# Patient Record
Sex: Female | Born: 1980 | Race: Black or African American | Hispanic: No | Marital: Single | Smoking: Never smoker
Health system: Southern US, Community
[De-identification: ages and names within clinical notes are randomized; demographics above are authoritative.]

## PROBLEM LIST (undated history)

## (undated) DIAGNOSIS — E119 Type 2 diabetes mellitus without complications: Secondary | ICD-10-CM

## (undated) DIAGNOSIS — B9689 Other specified bacterial agents as the cause of diseases classified elsewhere: Secondary | ICD-10-CM

## (undated) DIAGNOSIS — N760 Acute vaginitis: Secondary | ICD-10-CM

---

## 2015-04-09 ENCOUNTER — Encounter (HOSPITAL_COMMUNITY): Payer: Self-pay

## 2015-04-09 ENCOUNTER — Emergency Department (HOSPITAL_COMMUNITY)
Admission: EM | Admit: 2015-04-09 | Discharge: 2015-04-09 | Discharge status: Against Medical Advice | Payer: Medicaid Other | Attending: Emergency Medicine | Admitting: Emergency Medicine

## 2015-04-09 DIAGNOSIS — R1030 Lower abdominal pain, unspecified: Secondary | ICD-10-CM | POA: Diagnosis not present

## 2015-04-09 DIAGNOSIS — Z880 Allergy status to penicillin: Secondary | ICD-10-CM | POA: Diagnosis not present

## 2015-04-09 DIAGNOSIS — R42 Dizziness and giddiness: Secondary | ICD-10-CM | POA: Diagnosis not present

## 2015-04-09 DIAGNOSIS — N898 Other specified noninflammatory disorders of vagina: Secondary | ICD-10-CM | POA: Insufficient documentation

## 2015-04-09 DIAGNOSIS — R112 Nausea with vomiting, unspecified: Secondary | ICD-10-CM | POA: Diagnosis present

## 2015-04-09 DIAGNOSIS — Z5321 Procedure and treatment not carried out due to patient leaving prior to being seen by health care provider: Secondary | ICD-10-CM | POA: Diagnosis not present

## 2015-04-09 DIAGNOSIS — R5383 Other fatigue: Secondary | ICD-10-CM | POA: Diagnosis not present

## 2015-04-09 DIAGNOSIS — Z9119 Patient's noncompliance with other medical treatment and regimen: Secondary | ICD-10-CM

## 2015-04-09 DIAGNOSIS — Z5320 Procedure and treatment not carried out because of patient's decision for unspecified reasons: Secondary | ICD-10-CM

## 2015-04-09 LAB — COMPREHENSIVE METABOLIC PANEL
ALK PHOS: 97 U/L (ref 38–126)
ALT: 21 U/L (ref 14–54)
AST: 25 U/L (ref 15–41)
Albumin: 3.9 g/dL (ref 3.5–5.0)
Anion gap: 13 (ref 5–15)
BILIRUBIN TOTAL: 0.5 mg/dL (ref 0.3–1.2)
BUN: 9 mg/dL (ref 6–20)
CALCIUM: 9.1 mg/dL (ref 8.9–10.3)
CHLORIDE: 98 mmol/L — AB (ref 101–111)
CO2: 23 mmol/L (ref 22–32)
CREATININE: 0.95 mg/dL (ref 0.44–1.00)
GFR calc Af Amer: >60 mL/min (ref >60)
GLUCOSE: 360 mg/dL — AB (ref 65–99)
Potassium: 4.4 mmol/L (ref 3.5–5.1)
SODIUM: 134 mmol/L — AB (ref 135–145)
TOTAL PROTEIN: 7.6 g/dL (ref 6.5–8.1)

## 2015-04-09 LAB — URINALYSIS, ROUTINE W REFLEX MICROSCOPIC
BILIRUBIN URINE: NEGATIVE
Ketones, ur: 15 mg/dL — AB
NITRITE: NEGATIVE
PROTEIN: NEGATIVE mg/dL
Specific Gravity, Urine: 1.039 — ABNORMAL HIGH (ref 1.005–1.030)
Urobilinogen, UA: 0.2 mg/dL (ref 0.0–1.0)
pH: 6 (ref 5.0–8.0)

## 2015-04-09 LAB — URINE MICROSCOPIC-ADD ON

## 2015-04-09 LAB — CBC WITH DIFFERENTIAL/PLATELET
Basophils Absolute: 0 10*3/uL (ref 0.0–0.1)
Basophils Relative: 0 % (ref 0–1)
EOS ABS: 0.1 10*3/uL (ref 0.0–0.7)
EOS PCT: 2 % (ref 0–5)
HEMATOCRIT: 43.7 % (ref 36.0–46.0)
HEMOGLOBIN: 14.8 g/dL (ref 12.0–15.0)
Lymphocytes Relative: 41 % (ref 12–46)
Lymphs Abs: 2.5 10*3/uL (ref 0.7–4.0)
MCH: 29.1 pg (ref 26.0–34.0)
MCHC: 33.9 g/dL (ref 30.0–36.0)
MCV: 85.9 fL (ref 78.0–100.0)
MONO ABS: 0.5 10*3/uL (ref 0.1–1.0)
MONOS PCT: 7 % (ref 3–12)
NEUTROS ABS: 3.1 10*3/uL (ref 1.7–7.7)
NEUTROS PCT: 50 % (ref 43–77)
Platelets: 236 10*3/uL (ref 150–400)
RBC: 5.09 MIL/uL (ref 3.87–5.11)
RDW: 14 % (ref 11.5–15.5)
WBC: 6.2 10*3/uL (ref 4.0–10.5)

## 2015-04-09 LAB — I-STAT BETA HCG BLOOD, ED (MC, WL, AP ONLY)

## 2015-04-09 LAB — LIPASE, BLOOD: LIPASE: 15 U/L — AB (ref 22–51)

## 2015-04-09 MED ORDER — SODIUM CHLORIDE 0.9 % IV BOLUS (SEPSIS): 1000.0000 mL | Freq: Once | INTRAVENOUS | Status: DC

## 2015-04-09 NOTE — ED Notes (Addendum)
Pt c/o feeling "sick, feeling dizzy" x2 weeks and is concerned b/c she has "milky" vaginal discharge w/ no period x 2 months. Pt c/o feeling sick in morning. Pt hx diabetes and has not taken insulin in 6 months.

## 2015-04-09 NOTE — ED Notes (Signed)
Pt refusing IV at this time - states this is not necessary after explaining risks/benefits of not having IV

## 2015-04-09 NOTE — ED Provider Notes (Addendum)
CSN: [id number]     Arrival date & time 04/09/15  4098 History   First MD Initiated Contact with Patient 04/09/15 [phone]     Chief Complaint  Patient presents with  . Abdominal Pain  . Dizziness  . Vaginal Discharge     (Consider location/radiation/quality/duration/timing/severity/associated sxs/prior Treatment) HPI Patient moved to the area 6 months ago. Reports that she has been unable to continue her normal medical care for her diabetes. She is supposed to be on insulin and has not taken it for 6 months duration. For several weeks she reports having nausea and waking in the morning with vomiting. She states she has missed her menstrual cycle for at least 2 months. She also describes some lower abdominal discomfort and vaginal discharge. She denies blurred vision, fever, cough or shortness of breath. She states she is mostly just felt very fatigued and sick and lightheaded for about 2 months. The symptoms have gotten worse over about the past 2 weeks. No past medical history on file. No past surgical history on file. No family history on file. Social History  Substance Use Topics  . Smoking status: Never Smoker   . Smokeless tobacco: Never Used  . Alcohol Use: Yes     Comment: socially   OB History    No data available     Review of Systems 10 Systems reviewed and are negative for acute change except as noted in the HPI.    Allergies  Amoxicillin; Penicillins; and Reglan  Home Medications   Prior to Admission medications   Not on File   BP 129/88 mmHg  Pulse 96  Temp(Src) 98 F (36.7 C) (Oral)  Resp 16  Ht 5\' 6"  (1.676 m)  Wt 250 lb (113.399 kg)  BMI 40.37 kg/m2  SpO2 99%  LMP 02/07/2015 Physical Exam  Constitutional: She is oriented to person, place, and time. She appears well-developed and well-nourished.  The patient is ambulatory about the emergency department with well appearance.  HENT:  Head: Normocephalic and atraumatic.  Eyes: EOM are normal. Pupils  are equal, round, and reactive to light.  Neck: Neck supple.  Cardiovascular: Normal rate, regular rhythm, normal heart sounds and intact distal pulses.   Pulmonary/Chest: Effort normal and breath sounds normal.  Abdominal: Soft. Bowel sounds are normal. She exhibits no distension. There is no tenderness.  Genitourinary:  Patient left the emergency department prior to being able to perform her pelvic examination.  Musculoskeletal: Normal range of motion. She exhibits no edema.  Neurological: She is alert and oriented to person, place, and time. She has normal strength. Coordination normal. GCS eye subscore is 4. GCS verbal subscore is 5. GCS motor subscore is 6.  Skin: Skin is warm, dry and intact.  Psychiatric: She has a normal mood and affect.    ED Course  Procedures (including critical care time) Labs Review Labs Reviewed  URINALYSIS, ROUTINE W REFLEX MICROSCOPIC (NOT AT [facility]) - Abnormal; Notable for the following:    APPearance CLOUDY (*)    Specific Gravity, Urine 1.039 (*)    Glucose, UA >1000 (*)    Hgb urine dipstick TRACE (*)    Ketones, ur 15 (*)    Leukocytes, UA MODERATE (*)    All other components within normal limits  URINE MICROSCOPIC-ADD ON - Abnormal; Notable for the following:    Squamous Epithelial / LPF MANY (*)    Bacteria, UA FEW (*)    All other components within normal limits  WET PREP, GENITAL  CBC WITH DIFFERENTIAL/PLATELET  COMPREHENSIVE METABOLIC PANEL  LIPASE, BLOOD  HIV ANTIBODY (ROUTINE TESTING)  I-STAT BETA HCG BLOOD, ED ([facility], [facility], [facility] ONLY)  GC/CHLAMYDIA PROBE AMP ([place]) NOT AT [facility]    Imaging Review No results found. I, Arby Barrette, personally reviewed and evaluated these images and lab results as part of my medical decision-making.   EKG Interpretation None      MDM   Final diagnoses:  Left before treatment completed   Patient presented with above complaints. Shortly after her evaluation, she stated to nursing staff that  she wanted to leave the emergency department to take her son out to her mother. She was advised that we could not endorse her leaving the emergency department while her treatment was underway. She stated she would have her mother come in to get her son (about 33 years old). Shortly thereafter the patient had disappeared from the room leaving her gown and having removed the monitor. She has not returned. She is considered to have left before evaluation complete. She left before any diagnostic results had returned. The patient however was well in appearance and has stable vital signs.    Arby Barrette, MD 04/09/15 1118  Arby Barrette, MD 04/09/15 1120

## 2015-04-09 NOTE — ED Notes (Signed)
This RN went to round on pt - pt and family member were not in room. Pt eloped. Pt did not make any of the staff aware that she was leaving. EDP aware.

## 2015-04-10 LAB — HIV ANTIBODY (ROUTINE TESTING W REFLEX): HIV SCREEN 4TH GENERATION: NONREACTIVE

## 2015-05-06 ENCOUNTER — Encounter (HOSPITAL_COMMUNITY): Payer: Self-pay

## 2015-05-06 ENCOUNTER — Emergency Department (HOSPITAL_COMMUNITY)
Admission: EM | Admit: 2015-05-06 | Discharge: 2015-05-06 | Disposition: A | Discharge status: Home / Self Care | Payer: Medicaid Other | Attending: Emergency Medicine | Admitting: Emergency Medicine

## 2015-05-06 DIAGNOSIS — N898 Other specified noninflammatory disorders of vagina: Secondary | ICD-10-CM | POA: Diagnosis present

## 2015-05-06 DIAGNOSIS — E1165 Type 2 diabetes mellitus with hyperglycemia: Secondary | ICD-10-CM | POA: Diagnosis not present

## 2015-05-06 DIAGNOSIS — N760 Acute vaginitis: Secondary | ICD-10-CM | POA: Insufficient documentation

## 2015-05-06 DIAGNOSIS — J069 Acute upper respiratory infection, unspecified: Secondary | ICD-10-CM

## 2015-05-06 DIAGNOSIS — R739 Hyperglycemia, unspecified: Secondary | ICD-10-CM

## 2015-05-06 DIAGNOSIS — R1084 Generalized abdominal pain: Secondary | ICD-10-CM

## 2015-05-06 DIAGNOSIS — Z880 Allergy status to penicillin: Secondary | ICD-10-CM | POA: Insufficient documentation

## 2015-05-06 DIAGNOSIS — B9689 Other specified bacterial agents as the cause of diseases classified elsewhere: Secondary | ICD-10-CM

## 2015-05-06 HISTORY — DX: Type 2 diabetes mellitus without complications: E11.9

## 2015-05-06 HISTORY — DX: Other specified bacterial agents as the cause of diseases classified elsewhere: B96.89

## 2015-05-06 HISTORY — DX: Acute vaginitis: N76.0

## 2015-05-06 LAB — URINALYSIS, ROUTINE W REFLEX MICROSCOPIC
Bilirubin Urine: NEGATIVE
Hgb urine dipstick: NEGATIVE
Ketones, ur: NEGATIVE mg/dL
NITRITE: NEGATIVE
PROTEIN: NEGATIVE mg/dL
Specific Gravity, Urine: 1.044 — ABNORMAL HIGH (ref 1.005–1.030)
Urobilinogen, UA: 1 mg/dL (ref 0.0–1.0)
pH: 7 (ref 5.0–8.0)

## 2015-05-06 LAB — CBC WITH DIFFERENTIAL/PLATELET
BASOS ABS: 0 10*3/uL (ref 0.0–0.1)
Basophils Relative: 1 % (ref 0–1)
Eosinophils Absolute: 0.1 10*3/uL (ref 0.0–0.7)
Eosinophils Relative: 1 % (ref 0–5)
HEMATOCRIT: 40.6 % (ref 36.0–46.0)
HEMOGLOBIN: 13.5 g/dL (ref 12.0–15.0)
LYMPHS PCT: 49 % — AB (ref 12–46)
Lymphs Abs: 2.6 10*3/uL (ref 0.7–4.0)
MCH: 28.8 pg (ref 26.0–34.0)
MCHC: 33.3 g/dL (ref 30.0–36.0)
MCV: 86.6 fL (ref 78.0–100.0)
MONO ABS: 0.4 10*3/uL (ref 0.1–1.0)
Monocytes Relative: 7 % (ref 3–12)
NEUTROS ABS: 2.3 10*3/uL (ref 1.7–7.7)
NEUTROS PCT: 42 % — AB (ref 43–77)
Platelets: 236 10*3/uL (ref 150–400)
RBC: 4.69 MIL/uL (ref 3.87–5.11)
RDW: 13.6 % (ref 11.5–15.5)
WBC: 5.4 10*3/uL (ref 4.0–10.5)

## 2015-05-06 LAB — COMPREHENSIVE METABOLIC PANEL
ALBUMIN: 3.8 g/dL (ref 3.5–5.0)
ALT: 16 U/L (ref 14–54)
ANION GAP: 7 (ref 5–15)
AST: 24 U/L (ref 15–41)
Alkaline Phosphatase: 82 U/L (ref 38–126)
BUN: 13 mg/dL (ref 6–20)
CO2: 28 mmol/L (ref 22–32)
Calcium: 8.9 mg/dL (ref 8.9–10.3)
Chloride: 99 mmol/L — ABNORMAL LOW (ref 101–111)
Creatinine, Ser: 0.7 mg/dL (ref 0.44–1.00)
GFR calc non Af Amer: >60 mL/min (ref >60)
GLUCOSE: 317 mg/dL — AB (ref 65–99)
POTASSIUM: 4.4 mmol/L (ref 3.5–5.1)
SODIUM: 134 mmol/L — AB (ref 135–145)
Total Bilirubin: 0.8 mg/dL (ref 0.3–1.2)
Total Protein: 7.5 g/dL (ref 6.5–8.1)

## 2015-05-06 LAB — WET PREP, GENITAL
TRICH WET PREP: NONE SEEN
Yeast Wet Prep HPF POC: NONE SEEN

## 2015-05-06 LAB — CBG MONITORING, ED
GLUCOSE-CAPILLARY: 339 mg/dL — AB (ref 65–99)
Glucose-Capillary: 243 mg/dL — ABNORMAL HIGH (ref 65–99)

## 2015-05-06 LAB — URINE MICROSCOPIC-ADD ON

## 2015-05-06 LAB — I-STAT BETA HCG BLOOD, ED (MC, WL, AP ONLY): I-stat hCG, quantitative: <5 m[IU]/mL (ref <5)

## 2015-05-06 MED ORDER — ONDANSETRON HCL 4 MG PO TABS: 4.0000 mg | ORAL_TABLET | Freq: Four times a day (QID) | ORAL | Status: DC

## 2015-05-06 MED ORDER — ONDANSETRON HCL 4 MG/2ML IJ SOLN
4.0000 mg | Freq: Once | INTRAMUSCULAR | Status: AC
  Administered 2015-05-06: 4 mg via INTRAVENOUS
  Filled 2015-05-06: qty 2

## 2015-05-06 MED ORDER — METRONIDAZOLE 500 MG PO TABS: 500.0000 mg | ORAL_TABLET | Freq: Two times a day (BID) | ORAL | Status: DC

## 2015-05-06 MED ORDER — SODIUM CHLORIDE 0.9 % IV BOLUS (SEPSIS)
1000.0000 mL | Freq: Once | INTRAVENOUS | Status: AC
  Administered 2015-05-06: 1000 mL via INTRAVENOUS

## 2015-05-06 NOTE — ED Notes (Signed)
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  Has had symptoms for over 3 weeks.  White creamy discharge.  No odor.  Is painful. Pt is sexually active and uses condoms sometimes.  Pt states also wants checked for nasal congestion.  Symptoms started today.  3rd complaint is that she feels her diabetes is elevated.  Also mentioned, she has missed her period this month.

## 2015-05-06 NOTE — Discharge Instructions (Signed)
°Emergency Department Resource Guide °1) Find a Doctor and Pay Out of Pocket °Although you won't have to find out who is covered by your insurance plan, it is a good idea to ask around and get recommendations. You will then need to call the office and see if the doctor you have chosen will accept you as a new patient and what types of options they offer for patients who are self-pay. Some doctors offer discounts or will set up payment plans for their patients who do not have insurance, but you will need to ask so you aren't surprised when you get to your appointment. ° °2) Contact Your Local Health Department °Not all health departments have doctors that can see patients for sick visits, but many do, so it is worth a call to see if yours does. If you don't know where your local health department is, you can check in your phone book. The CDC also has a tool to help you locate your state's health department, and many state websites also have listings of all of their local health departments. ° °3) Find a Walk-in Clinic °If your illness is not likely to be very severe or complicated, you may want to try a walk in clinic. These are popping up all over the country in pharmacies, drugstores, and shopping centers. They're usually staffed by nurse practitioners or physician assistants that have been trained to treat common illnesses and complaints. They're usually fairly quick and inexpensive. However, if you have serious medical issues or chronic medical problems, these are probably not your best option. ° °No Primary Care Doctor: °- Call [facility] at  832-8000 - they can help you locate a primary care doctor that  accepts your insurance, provides certain services, etc. °- Physician Referral Service- 1-800-533-3463 ° °Chronic Pain Problems: °Organization         Address  Phone   Notes  °[facility]  (336) 297-2271 Patients need to be referred by their primary care doctor.  ° °Medication  Assistance: °Organization         Address  Phone   Notes  °[place] [facility] [address] °[place], [place] [address] (336) 641-8030 --Must be a resident of [place] °-- Must have NO insurance coverage whatsoever (no Medicaid/ Medicare, etc.) °-- The pt. MUST have a primary care doctor that directs their care regularly and follows them in the community °  °[facility]  (866) 331-1348   °[employer]  (888) 892-1162   ° °Agencies that provide inexpensive medical care: °Organization         Address  Phone   Notes  °[facility]  (336) 832-8035   °[facility]    (336) 832-7272   °[facility] [address] °[place], [place] [address] (336) 832-4777   °[facility] [address], °[place] (336) 271-4999   °[facility]    (336) 373-0678   °[facility]    (336) 272-1050   °[facility] ° [address], [place] Phone:  (336) 832-4444, Fax:  (336) 832-4440 Hours of Operation:  9 am - 6 pm, M-F.  Also accepts Medicaid/Medicare and self-pay.  °[facility] ° [address], [place] Phone: (336) 832-3150, Fax: (336) 832-3151. Hours of Operation:  8:30 am - 5:30 pm, M-F.  Also accepts Medicaid and self-pay.  °[facility] [place] [address], [place] Phone: (336) 878-6027   °[facility] [address], [place], [place] (336)723-1848, Ext. 123 Mondays & Thursdays: 7-9 AM.  First 15 patients are seen on a first come, first serve basis. °  ° °Medicaid-accepting [place] Providers: ° °Organization         Address  Phone   Notes  °[facility] [address], [place] (336) 641-2100 Also accepts self-pay patients.  °[facility] [address], [place] ° (336) 856-9996   °[facility] [address], [place]  (336) 288-8857   °[facility] [address] [place] [address], [place] (336) 299-7000   °Veita Bland [address], [place]  ° (336) 373-1557 Only accepts [place] Access Medicaid patients after they have their name applied to their card.  ° °Self-Pay (no insurance) in [place]: ° °Organization         Address  Phone   Notes  °Sickle Cell Patients, [facility] [address], [place] (336) 832-1970   °[facility] [address], [place] (336) 832-4400   °[facility] ° [address], [place] (336) 992-4800   °[facility]/Dr. Osei-Bonsu ° [address] [place] [address], [place] or [address], [place] (336) 841-8500 Phone number for both [place] and [place] locations is the same.  °[facility] [address], [place] (336) 299-0000   °[facility] [address] [place] [address], [place] or [address] (336) 852-7530 °(336) 878-2260   °[facility] [address], [place] (336) 350-1642, phone; (336) 294-5005, fax Sees patients 1st and 3rd Saturday of every month.  Must not qualify for public or private insurance (i.e. Medicaid, Medicare, [place] Health Choice, Veterans' Benefits) • Household income should be no more than 200% of the poverty level •The clinic cannot treat you if you are pregnant or think you are pregnant • Sexually transmitted diseases are not treated at the clinic.  ° ° °Dental Care: °Organization         Address  Phone  Notes  °[place] [facility] [address], [place] (336) 641-6152 Accepts children up to age 21 who are enrolled in Medicaid or [place] Health Choice; pregnant women with a Medicaid card; and children who have applied for Medicaid or [place] Health Choice, but were declined, whose parents can pay a reduced fee at time of service.  °[place]  [facility] [place]  [address], [place] (336) 641-7733 Accepts children up to age 21 who are enrolled in Medicaid or [place] Health Choice; pregnant women with a Medicaid card; and children who have applied for Medicaid or [place] Health Choice, but were declined, whose parents can pay a reduced fee at time of service.  °[facility] ° [address],  (336) 641-4533 Patients are seen by appointment only. Walk-ins are not accepted. [facility] will see patients 18 years of age and older. °Monday - Tuesday (8am-5pm) °Most Wednesdays (8:30-5pm) °$30 per visit, cash only  °[facility] ° [address], [place] (336) 641-4533 Patients are seen by appointment only. Walk-ins are not accepted. [facility] will see patients 18 years of age and older. °One   Wednesday Evening (Monthly: Volunteer Based).  $30 per visit, cash only  °[facility]  (919) 537-3737 for adults; Children under age 4, call Graduate Pediatric Dentistry at ([phone]) [phone]. Children aged 4-14, please call (919) [phone] to request a pediatric application. ° Dental services are provided in all areas of dental care including fillings, crowns and bridges, complete and partial dentures, implants, gum treatment, root canals, and extractions. Preventive care is also provided. Treatment is provided to both adults and children. °Patients are selected via a lottery and there is often a waiting list. °  °[facility] [address], °[place] ° (336) 763-8833 [email or web address] °  °[facility] [address], [place], [place] (336)723-1848, Ext. 123 Second and Fourth Thursday of each month, opens at 6:30 AM; Clinic ends at 9 AM.  Patients are seen on a first-come first-served basis, and a limited number are seen during each clinic.  ° °[facility] ° [address], [place], [place] (336) 723-7904    Eligibility Requirements °You must have lived in [place], [place], or [place] counties for at least the last three months. °  You cannot be eligible for state or federal sponsored healthcare insurance, including Veterans Administration, Medicaid, or Medicare. °  You generally cannot be eligible for healthcare insurance through your employer.  °  How to apply: °Eligibility screenings are held every Tuesday and Wednesday afternoon from 1:00 pm until 4:00 pm. You do not need an appointment for the interview!  °[facility] [address], [place], [place] 336-631-2330   °[facility]  336-342-8273   °[facility]  336-703-3100   °[facility]  336-570-6415   ° °Behavioral Health Resources in the Community: °Intensive Outpatient Programs °Organization         Address  Phone  Notes  °[facility] [address], [place], [place] 336-878-6098   °[facility] [address], [place], [place] 336-832-9800   °[facility] [address], [place], [place] ° 336-882-2125   °[facility] [address],  °[place], [place] 1-800-853-5163 or 336-641-4981   °Substance Abuse Resources °Organization         Address  Phone  Notes  °[facility]  336-882-2125   °[facility]  336-784-9470   °[facility]  336-285-9073   °[facility]  336-845-3988   °Residential & Outpatient Substance Abuse Program  1-800-659-3381   °Psychological Services °Organization         Address  Phone  Notes  °[facility]  336- 832-9600   °[facility]  336- 378-7881   °[facility] [address], [place] 1-800-853-5163 or 336-641-4981   ° °Mobile Crisis Teams °Organization         Address  Phone  Notes  °[facility], Mobile Crisis Care Unit  1-877-626-1772   °Assertive °[facility] ° [address]  [place], [place] 336-834-9664   °Sharon DeEsch [address] °[place] 336-554-5454   ° °Self-Help/Support Groups °Organization         Address  Phone             Notes  °[facility] [place] - variety of support groups  336- 373-1402 Call for more information  °Narcotics Anonymous (NA), [facility] [address], °[place]   2 meetings at this location  ° °  Residential Treatment Programs °Organization         Address  Phone  Notes  °[facility] [address],    °[place]  1-866-801-8205   °[facility] ° [address], [place], [place] 704-293-8524   °[facility] [address], [place] 336-845-3988 Admissions: 8am-3pm M-F  °[facility] [address]    °[place], [place] 336-841-1104   °[facility] [address], [place], [place] 336-379-7146   °[facility] [address]  °[place], [place] 336-285-9073   °[facility] Intensive Outpatient [address], [place], [place] 336-852-3033   °[facility] [address]  °[place], [place] 1-877-615-2722 or 336-784-9470   °[facility] [address] [place], [place] 336-227-7417 Accepts Medicaid  °[facility] [address]  °[place] 1-800-659-3381 Substance Abuse/Addiction Treatment  ° °[place] Behavioral Health Resources °Organization         Address  Phone  Notes  °[facility]  (888) 581-9988   °Julie Brannon, PhD [address] [place], [place]   (336) 349-5553 or (336) 951-0000   °[facility]   [address] °[place], [place] (336) 349-4454   °[facility] [address], [place], [place] (336) 342-8316 Insurance/Medicaid/sponsorship through [facility]  °[facility] [address]                                    [place], [place] (336) 342-8316 Therapy/tele-psych/case    °[facility] [address]  ° [place], [place] (336) 349-2233    °Dr. Arfeen  (336) 349-4544   °[facility] [place]  [employer] [place] [facility] 1) [address],  °2) [address], [place] °3)  [address], [place] (336) 349-3220 °(336) 342-7768 ° °(336) 342-8140   °[place] [facility] (336) 342-1394 or (336) 342-3537 (After Hours)    ° ° °

## 2015-05-06 NOTE — ED Notes (Signed)
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

## 2015-05-06 NOTE — ED Provider Notes (Signed)
CSN: [id number]     Arrival date & time 05/06/15  0844 History   First MD Initiated Contact with Patient 05/06/15 [phone]     Chief Complaint  Patient presents with  . Vaginal Discharge  . Nasal Congestion     (Consider location/radiation/quality/duration/timing/severity/associated sxs/prior Treatment) HPI Comments: Patient with a history of Insulin Dependent Type II DM presents today with multiple complaints.  She is complaining of vaginal discharge.  Discharge is white creamy and has been present intermittently for the past 2 months.  She was seen for the same in the ED on 04/09/15., but left AMA prior to completion of pelvic exam and work up. She states that she was also seen in July, 2 months ago, for the same and was treated for BV with Flagyl.   She also is complaining of diffuse abdominal pain that has been present for the past couple of months.  Pain is unchanged.  She is not taking anything for pain.  She also reports intermittent nausea and vomiting over the past month.  She denies diarrhea or constipation.  Denies urinary symptoms.    Patient is also complaining of nasal congestion, cough, and sore throat. Onset of symptoms yesterday evening.  She has taken Dayquil for the symptoms without relief.  She denies chest pain or SOB.  Denies ear pain or sinus pain.    She is also complaining of an elevated blood sugar.  She states that she has been on Insulin in the past, but has not taken the Insulin in the past 7 months.  She reports that she has checked her blood sugar intermittently and it has been running in the 300's.  She states that she does not currently have a PCP.    Patient is a 34 y.o. female presenting with vaginal discharge. The history is provided by the patient.  Vaginal Discharge   Past Medical History  Diagnosis Date  . Diabetes mellitus without complication   . Bacterial vaginosis    Past Surgical History  Procedure Laterality Date  . Cesarean section     History  reviewed. No pertinent family history. Social History  Substance Use Topics  . Smoking status: Never Smoker   . Smokeless tobacco: Never Used  . Alcohol Use: Yes     Comment: socially   OB History    No data available     Review of Systems  Genitourinary: Positive for vaginal discharge.  All other systems reviewed and are negative.     Allergies  Amoxicillin; Penicillins; and Reglan  Home Medications   Prior to Admission medications   Not on File   BP 134/80 mmHg  Pulse 81  Temp(Src) 98.1 F (36.7 C) (Oral)  Resp 18  SpO2 99%  LMP 02/07/2015 Physical Exam  Constitutional: She appears well-developed and well-nourished.  HENT:  Head: Normocephalic and atraumatic.  Mouth/Throat: Oropharynx is clear and moist.  Neck: Normal range of motion. Neck supple.  Cardiovascular: Normal rate, regular rhythm and normal heart sounds.   Pulmonary/Chest: Effort normal and breath sounds normal.  Abdominal: Soft. Bowel sounds are normal. She exhibits no distension and no mass. There is no tenderness. There is no rebound and no guarding.  Genitourinary: Cervix exhibits no motion tenderness. Right adnexum displays no mass, no tenderness and no fullness. Left adnexum displays no mass, no tenderness and no fullness.  Musculoskeletal: Normal range of motion.  Neurological: She is alert.  Skin: Skin is warm and dry.  Psychiatric: She has a normal mood  and affect.  Nursing note and vitals reviewed.   ED Course  Procedures (including critical care time) Labs Review Labs Reviewed  WET PREP, GENITAL - Abnormal; Notable for the following:    Clue Cells Wet Prep HPF POC MODERATE (*)    WBC, Wet Prep HPF POC FEW (*)    All other components within normal limits  URINALYSIS, ROUTINE W REFLEX MICROSCOPIC (NOT AT [facility]) - Abnormal; Notable for the following:    APPearance TURBID (*)    Specific Gravity, Urine 1.044 (*)    Glucose, UA >1000 (*)    Leukocytes, UA SMALL (*)    All other  components within normal limits  CBC WITH DIFFERENTIAL/PLATELET - Abnormal; Notable for the following:    Neutrophils Relative % 42 (*)    Lymphocytes Relative 49 (*)    All other components within normal limits  COMPREHENSIVE METABOLIC PANEL - Abnormal; Notable for the following:    Sodium 134 (*)    Chloride 99 (*)    Glucose, Bld 317 (*)    All other components within normal limits  URINE MICROSCOPIC-ADD ON - Abnormal; Notable for the following:    Squamous Epithelial / LPF MANY (*)    Bacteria, UA MANY (*)    All other components within normal limits  CBG MONITORING, ED - Abnormal; Notable for the following:    Glucose-Capillary 339 (*)    All other components within normal limits  CBG MONITORING, ED - Abnormal; Notable for the following:    Glucose-Capillary 243 (*)    All other components within normal limits  URINE CULTURE  RPR  HIV ANTIBODY (ROUTINE TESTING)  I-STAT BETA HCG BLOOD, ED ([facility], [facility], [facility] ONLY)  GC/CHLAMYDIA PROBE AMP ([place]) NOT AT [facility]    Imaging Review No results found. I have personally reviewed and evaluated these images and lab results as part of my medical decision-making.   EKG Interpretation None     12:57 PM Reassessed patient.  Patient reports improvement of her symptoms.  Patient playing on her cell phone and eating food.  MDM   Final diagnoses:  None   Patient presents today with multiple complaints.  She is complaining of vaginal discharge, nasal congestion, and hyperglycemia.  She states that the vaginal discharge has been present intermittently over the past 2 months.  Wet prep today showing moderate clue cells.  GC/Chlamydia pending.  Patient started to 7 day course of Flagyl.  No CMT or adnexal tenderness on exam.  Pregnancy test is negative.  Patient also complaining of URI symptoms.  Onset of symptoms last evening.  Lungs CTAB..  Pulse ox 99-100 on RA.  Therefore, do not feel that CXR is indicated at this time.  Patient also  complaining of hyperglycemia.  Patient with known history of DM type II, but has been out of her insulin for 7 months due to the fact that she does not have a PCP.  Patient is hyperglycemic.  No signs of DKA.  Anion gap=7.  Normal bicarb.  No ketones in the Urine.  Blood sugar improved in the ED.  Patient states that she cannot take Metformin.  Patient referred to [facility] and also given resource guide.  Feel that insulin should be initiated by a PCP due to the fact that she has been off of it for 7 months.  Patient in agreement with the plan.  Return precautions given.      Santiago Glad, PA-C 05/07/15 1156  Glynn Octave,  MD 05/08/15 0022

## 2015-05-07 LAB — HIV ANTIBODY (ROUTINE TESTING W REFLEX): HIV Screen 4th Generation wRfx: NONREACTIVE

## 2015-05-07 LAB — RPR: RPR Ser Ql: NONREACTIVE

## 2015-05-08 LAB — URINE CULTURE: Special Requests: NORMAL

## 2015-05-09 LAB — GC/CHLAMYDIA PROBE AMP (~~LOC~~) NOT AT ARMC
Chlamydia: NEGATIVE
NEISSERIA GONORRHEA: NEGATIVE

## 2015-05-18 ENCOUNTER — Emergency Department (HOSPITAL_COMMUNITY)
Admission: EM | Admit: 2015-05-18 | Discharge: 2015-05-18 | Discharge status: Against Medical Advice | Payer: Medicaid Other | Attending: Emergency Medicine | Admitting: Emergency Medicine

## 2015-05-18 ENCOUNTER — Encounter (HOSPITAL_COMMUNITY): Payer: Self-pay | Admitting: Emergency Medicine

## 2015-05-18 DIAGNOSIS — E119 Type 2 diabetes mellitus without complications: Secondary | ICD-10-CM | POA: Diagnosis not present

## 2015-05-18 DIAGNOSIS — R102 Pelvic and perineal pain: Secondary | ICD-10-CM | POA: Insufficient documentation

## 2015-05-18 NOTE — Progress Notes (Signed)
pcp is [facility] [address] [place] [address] [phone]

## 2015-05-18 NOTE — ED Notes (Signed)
Patient c/o ongoing vaginal pain and discharge, reports it is burning. Patient states she used Vagisil but did not get significant relief. C/o itching, burning, and severe sensitivity. Patient states it hurts to even walk.

## 2015-05-18 NOTE — ED Notes (Signed)
Attempted to call patient in the waiting room, no response. Will attempt again shortly

## 2015-05-19 ENCOUNTER — Emergency Department (HOSPITAL_COMMUNITY)
Admission: EM | Admit: 2015-05-19 | Discharge: 2015-05-20 | Disposition: A | Discharge status: Home / Self Care | Payer: Medicaid Other | Attending: Emergency Medicine | Admitting: Emergency Medicine

## 2015-05-19 ENCOUNTER — Encounter (HOSPITAL_COMMUNITY): Payer: Self-pay

## 2015-05-19 DIAGNOSIS — Z792 Long term (current) use of antibiotics: Secondary | ICD-10-CM | POA: Insufficient documentation

## 2015-05-19 DIAGNOSIS — E109 Type 1 diabetes mellitus without complications: Secondary | ICD-10-CM | POA: Diagnosis not present

## 2015-05-19 DIAGNOSIS — B9689 Other specified bacterial agents as the cause of diseases classified elsewhere: Secondary | ICD-10-CM

## 2015-05-19 DIAGNOSIS — N760 Acute vaginitis: Secondary | ICD-10-CM | POA: Insufficient documentation

## 2015-05-19 DIAGNOSIS — B3731 Acute candidiasis of vulva and vagina: Secondary | ICD-10-CM

## 2015-05-19 DIAGNOSIS — Z794 Long term (current) use of insulin: Secondary | ICD-10-CM | POA: Insufficient documentation

## 2015-05-19 DIAGNOSIS — R102 Pelvic and perineal pain: Secondary | ICD-10-CM | POA: Diagnosis present

## 2015-05-19 DIAGNOSIS — Z3202 Encounter for pregnancy test, result negative: Secondary | ICD-10-CM | POA: Insufficient documentation

## 2015-05-19 DIAGNOSIS — B373 Candidiasis of vulva and vagina: Secondary | ICD-10-CM | POA: Diagnosis not present

## 2015-05-19 LAB — WET PREP, GENITAL: Trich, Wet Prep: NONE SEEN

## 2015-05-19 LAB — POC URINE PREG, ED: Preg Test, Ur: NEGATIVE

## 2015-05-19 MED ORDER — INSULIN ASPART 100 UNIT/ML ~~LOC~~ SOLN: 16.0000 [IU] | Freq: Three times a day (TID) | SUBCUTANEOUS | Status: DC

## 2015-05-19 MED ORDER — LIDOCAINE HCL 1 % IJ SOLN
INTRAMUSCULAR | Status: AC
  Administered 2015-05-19: 0.9 mL
  Filled 2015-05-19: qty 20

## 2015-05-19 MED ORDER — INSULIN GLARGINE 100 UNIT/ML SOLOSTAR PEN: 27.0000 [IU] | PEN_INJECTOR | Freq: Every day | SUBCUTANEOUS | Status: DC

## 2015-05-19 MED ORDER — HYDROCORTISONE 1 % EX CREA
TOPICAL_CREAM | Freq: Once | CUTANEOUS | Status: AC
  Administered 2015-05-19: via TOPICAL
  Filled 2015-05-19: qty 28

## 2015-05-19 MED ORDER — CEFTRIAXONE SODIUM 250 MG IJ SOLR
250.0000 mg | Freq: Once | INTRAMUSCULAR | Status: AC
  Administered 2015-05-19: 250 mg via INTRAMUSCULAR
  Filled 2015-05-19: qty 250

## 2015-05-19 MED ORDER — GLUCOSE BLOOD VI STRP: ORAL_STRIP | Status: DC

## 2015-05-19 MED ORDER — AZITHROMYCIN 250 MG PO TABS
1000.0000 mg | ORAL_TABLET | Freq: Once | ORAL | Status: AC
  Administered 2015-05-19: 1000 mg via ORAL
  Filled 2015-05-19: qty 4

## 2015-05-19 MED ORDER — METRONIDAZOLE 500 MG PO TABS
500.0000 mg | ORAL_TABLET | Freq: Two times a day (BID) | ORAL | Status: DC
Start: 2015-05-19 — End: 2015-06-08

## 2015-05-19 NOTE — ED Provider Notes (Signed)
CSN: [id number]     Arrival date & time 05/19/15  2122 History   First MD Initiated Contact with Patient 05/19/15 2153     Chief Complaint  Patient presents with  . Vaginal Pain     (Consider location/radiation/quality/duration/timing/severity/associated sxs/prior Treatment) HPI   PCP: PROVIDER NOT IN SYSTEM PMH: uncontrolled diabetes and frequent vaginal infections  Kara Hayes is a 34 y.o.  female  CHIEF COMPLAINT: vaginal pain and discharge for a few weeks with odor  Time of onset: a few weeks ago        Quality:  Burning, discharge and odor        Progression:  worsening Chronicity: chronic, patient was seen on 8/13 and 9/9 for the same complaint. Risk factors: diabetes Treatments tried:  Cipro/flagyl, insulin Relieved by: medications temporarily and then returns Worsened by: hyperglycemia Associated Symptoms:  Burning, odor, vaginal pain  Patient reports being a diabetic and not taking any of her medications or having strips to check her glucose. She does not know what kind of strips she uses. She declines having a CBG checked because last time she says she got an IV and fluids and does not want to have that done. She reports feeling tired and having frequent vaginal infections  ROS: The patient denies vaginal bleeding, diaphoresis, fever, headache, weakness (general or focal), confusion, change of vision,  dysphagia, aphagia, shortness of breath,  abdominal pains, nausea, vomiting, diarrhea, lower extremity swelling, rash, neck pain, chest pain   Past Medical History  Diagnosis Date  . Diabetes mellitus without complication   . Bacterial vaginosis    Past Surgical History  Procedure Laterality Date  . Cesarean section     History reviewed. No pertinent family history. Social History  Substance Use Topics  . Smoking status: Never Smoker   . Smokeless tobacco: Never Used  . Alcohol Use: Yes     Comment: socially   OB History    No data available      Review of Systems  10 Systems reviewed and are negative for acute change except as noted in the HPI.    Allergies  Amoxicillin; Penicillins; and Reglan  Home Medications   Prior to Admission medications   Medication Sig Start Date End Date Taking? Authorizing Provider  metroNIDAZOLE (FLAGYL) 500 MG tablet Take 1 tablet (500 mg total) by mouth 2 (two) times daily. 05/06/15  Yes Heather Laisure, PA-C  ondansetron (ZOFRAN) 4 MG tablet Take 1 tablet (4 mg total) by mouth every 6 (six) hours. Patient taking differently: Take 4 mg by mouth every 6 (six) hours as needed for nausea or vomiting.  05/06/15  Yes Heather Laisure, PA-C  insulin aspart (NOVOLOG) 100 UNIT/ML injection Inject 16 Units into the skin 3 (three) times daily.    Historical Provider, MD  Insulin Glargine (LANTUS SOLOSTAR) 100 UNIT/ML Solostar Pen Inject 27 Units into the skin at bedtime.    Historical Provider, MD   BP 118/78 mmHg  Pulse 86  Temp(Src) 97.9 F (36.6 C) (Oral)  Resp 20  SpO2 97%  LMP 03/17/2015 (Approximate) Physical Exam  Constitutional: She appears well-developed and well-nourished. No distress.  HENT:  Head: Normocephalic and atraumatic.  Eyes: Pupils are equal, round, and reactive to light.  Neck: Normal range of motion. Neck supple.  Cardiovascular: Normal rate and regular rhythm.   Pulmonary/Chest: Effort normal.  Abdominal: Soft. Bowel sounds are normal. She exhibits no distension and no fluid wave. There is no tenderness. There is no guarding.  Genitourinary: There is rash and tenderness (inflammed) on the right labia. There is tenderness (inflammed) on the left labia. There is erythema in the vagina. No tenderness or bleeding in the vagina. No foreign body around the vagina. No signs of injury around the vagina. Vaginal discharge found.  Yeast to external genitalia  Copious amount of cottage cheese discharge mixed with creamy lime green discharge.  Neurological: She is alert.  Skin: Skin is  warm and dry.  Nursing note and vitals reviewed.   ED Course  Procedures (including critical care time) Labs Review Labs Reviewed  WET PREP, GENITAL - Abnormal; Notable for the following:    Yeast Wet Prep HPF POC MANY (*)    Clue Cells Wet Prep HPF POC MANY (*)    WBC, Wet Prep HPF POC MANY (*)    All other components within normal limits  URINALYSIS, ROUTINE W REFLEX MICROSCOPIC (NOT AT [facility])  POC URINE PREG, ED  GC/CHLAMYDIA PROBE AMP ([place]) NOT AT [facility]    Imaging Review No results found. I have personally reviewed and evaluated these images and lab results as part of my medical decision-making.   EKG Interpretation None      MDM   Final diagnoses:  Vaginal yeast infection  BV (bacterial vaginosis)   Patient with diabetes, non complaint and not well controlled. Refuses CBG. Long conversation about how so long as glucose is not well controlled she will continue to keep infections. Advised of the danger of uncontrolled glucose and not checking and treating today. She voices understanding  Will order glucose strips and refill of medications. Pt advised not to take insulin unless she checks her sugar first. Wet prep shows many WBC's, clue cells and yeast. No systemic symptoms, afebrile denies belly pain.  Medications  hydrocortisone cream 1 % (not administered)  azithromycin (ZITHROMAX) tablet 1,000 mg (not administered)  cefTRIAXone (ROCEPHIN) injection 250 mg (not administered)    Rx: PO Diflucan, cipro and topical lotrimin cream Also rx Novolog and lantus plus glucose stips.   34 y.o.Kara Hayes's evaluation in the Emergency Department is complete. It has been determined that no acute conditions requiring further emergency intervention are present at this time. The patient/guardian have been advised of the diagnosis and plan. We have discussed signs and symptoms that warrant return to the ED, such as changes or worsening in symptoms.  Vital signs are  stable at discharge. Filed Vitals:   05/19/15 2135  BP: 118/78  Pulse: 86  Temp: 97.9 F (36.6 C)  Resp: 20    Patient/guardian has voiced understanding and agreed to follow-up with the PCP or specialist.     Marlon Pel, PA-C 05/19/15 2342  Marily Memos, MD 05/20/15 [phone]

## 2015-05-19 NOTE — Discharge Instructions (Signed)
Bacterial Vaginosis Bacterial vaginosis is a vaginal infection that occurs when the normal balance of bacteria in the vagina is disrupted. It results from an overgrowth of certain bacteria. This is the most common vaginal infection in women of childbearing age. Treatment is important to prevent complications, especially in pregnant women, as it can cause a premature delivery. CAUSES  Bacterial vaginosis is caused by an increase in harmful bacteria that are normally present in smaller amounts in the vagina. Several different kinds of bacteria can cause bacterial vaginosis. However, the reason that the condition develops is not fully understood. RISK FACTORS Certain activities or behaviors can put you at an increased risk of developing bacterial vaginosis, including:  Having a new sex partner or multiple sex partners.  Douching.  Using an intrauterine device (IUD) for contraception. Women do not get bacterial vaginosis from toilet seats, bedding, swimming pools, or contact with objects around them. SIGNS AND SYMPTOMS  Some women with bacterial vaginosis have no signs or symptoms. Common symptoms include:  Grey vaginal discharge.  A fishlike odor with discharge, especially after sexual intercourse.  Itching or burning of the vagina and vulva.  Burning or pain with urination. DIAGNOSIS  Your health care provider will take a medical history and examine the vagina for signs of bacterial vaginosis. A sample of vaginal fluid may be taken. Your health care provider will look at this sample under a microscope to check for bacteria and abnormal cells. A vaginal pH test may also be done.  TREATMENT  Bacterial vaginosis may be treated with antibiotic medicines. These may be given in the form of a pill or a vaginal cream. A second round of antibiotics may be prescribed if the condition comes back after treatment.  HOME CARE INSTRUCTIONS   Only take over-the-counter or prescription medicines as  directed by your health care provider.  If antibiotic medicine was prescribed, take it as directed. Make sure you finish it even if you start to feel better.  Do not have sex until treatment is completed.  Tell all sexual partners that you have a vaginal infection. They should see their health care provider and be treated if they have problems, such as a mild rash or itching.  Practice safe sex by using condoms and only having one sex partner. SEEK MEDICAL CARE IF:   Your symptoms are not improving after 3 days of treatment.  You have increased discharge or pain.  You have a fever. MAKE SURE YOU:   Understand these instructions.  Will watch your condition.  Will get help right away if you are not doing well or get worse. FOR MORE INFORMATION  Centers for Disease Control and Prevention, Division of STD Prevention: AppraiserFraud.fi American Sexual Health Association (ASHA): www.ashastd.org  Document Released: 08/13/2005 Document Revised: 06/03/2013 Document Reviewed: 03/25/2013 [facility] Patient Information 2015 [place], [place]. This information is not intended to replace advice given to you by your health care provider. Make sure you discuss any questions you have with your health care provider.  Candidal Vulvovaginitis Candidal vulvovaginitis is an infection of the vagina and vulva. The vulva is the skin around the opening of the vagina. This may cause itching and discomfort in and around the vagina.  HOME CARE  Only take medicine as told by your doctor.  Do not have sex (intercourse) until the infection is healed or as told by your doctor.  Practice safe sex.  Tell your sex partner about your infection.  Do not douche or use tampons.  Wear  cotton underwear. Do not wear tight pants or panty hose.  Eat yogurt. This may help treat and prevent yeast infections. GET HELP RIGHT AWAY IF:   You have a fever.  Your problems get worse during treatment or do not get better in 3  days.  You have discomfort, irritation, or itching in your vagina or vulva area.  You have pain after sex.  You start to get belly (abdominal) pain. MAKE SURE YOU:  Understand these instructions.  Will watch your condition.  Will get help right away if you are not doing well or get worse. Document Released: 11/09/2008 Document Revised: 08/18/2013 Document Reviewed: 11/09/2008 [facility] Patient Information 2015 [place], [place]. This information is not intended to replace advice given to you by your health care provider. Make sure you discuss any questions you have with your health care provider.  High Blood Sugar High blood sugar (hyperglycemia) means that the level of sugar in your blood is higher than it should be. Signs of high blood sugar include:  Feeling thirsty.  Frequent peeing (urinating).  Feeling tired or sleepy.  Dry mouth.  Vision changes.  Feeling weak.  Feeling hungry but losing weight.  Numbness and tingling in your hands or feet.  Headache. When you ignore these signs, your blood sugar may keep going up. These problems may get worse, and other problems may begin. HOME CARE  Check your blood sugars as told by your doctor. Write down the numbers with the date and time.  Take the right amount of insulin or diabetes pills at the right time. Write down the dose with date and time.  Refill your insulin or diabetes pills before running out.  Watch what you eat. Follow your meal plan.  Drink liquids without sugar, such as water. Check with your doctor if you have kidney or heart disease.  Follow your doctor's orders for exercise. Exercise at the same time of day.  Keep your doctor's appointments. GET HELP RIGHT AWAY IF:   You have trouble thinking or are confused.  You have fast breathing with fruity smelling breath.  You pass out (faint).  You have 2 to 3 days of high blood sugars and you do not know why.  You have chest pain.  You are feeling  sick to your stomach (nauseous) or throwing up (vomiting).  You have sudden vision changes. MAKE SURE YOU:   Understand these instructions.  Will watch your condition.  Will get help right away if you are not doing well or get worse. Document Released: 06/10/2009 Document Revised: 11/05/2011 Document Reviewed: 06/10/2009 [facility] Patient Information 2015 [place], [place]. This information is not intended to replace advice given to you by your health care provider. Make sure you discuss any questions you have with your health care provider.

## 2015-05-19 NOTE — ED Notes (Signed)
Pt complains of vaginal pain for a few weeks, she complains of burning, discharge and an odor

## 2015-05-20 MED ORDER — FLUCONAZOLE 200 MG PO TABS: 200.0000 mg | ORAL_TABLET | Freq: Every day | ORAL | Status: AC

## 2015-05-20 NOTE — ED Notes (Signed)
Pt was educated on the importance of balancing blood sugar. Pt alert, oriented, and ambulatory upon DC. She leaves with all prescriptions in hand.

## 2015-05-23 LAB — GC/CHLAMYDIA PROBE AMP (~~LOC~~) NOT AT ARMC
Chlamydia: NEGATIVE
Neisseria Gonorrhea: NEGATIVE

## 2015-06-08 ENCOUNTER — Emergency Department (HOSPITAL_COMMUNITY)
Admission: EM | Admit: 2015-06-08 | Discharge: 2015-06-08 | Disposition: A | Discharge status: Home / Self Care | Payer: Medicaid Other | Attending: Emergency Medicine | Admitting: Emergency Medicine

## 2015-06-08 ENCOUNTER — Encounter (HOSPITAL_COMMUNITY): Payer: Self-pay | Admitting: Emergency Medicine

## 2015-06-08 DIAGNOSIS — S6991XA Unspecified injury of right wrist, hand and finger(s), initial encounter: Secondary | ICD-10-CM | POA: Insufficient documentation

## 2015-06-08 DIAGNOSIS — E119 Type 2 diabetes mellitus without complications: Secondary | ICD-10-CM | POA: Insufficient documentation

## 2015-06-08 DIAGNOSIS — S6992XA Unspecified injury of left wrist, hand and finger(s), initial encounter: Secondary | ICD-10-CM | POA: Insufficient documentation

## 2015-06-08 DIAGNOSIS — Y998 Other external cause status: Secondary | ICD-10-CM | POA: Diagnosis not present

## 2015-06-08 DIAGNOSIS — Y92039 Unspecified place in apartment as the place of occurrence of the external cause: Secondary | ICD-10-CM | POA: Diagnosis not present

## 2015-06-08 DIAGNOSIS — W868XXA Exposure to other electric current, initial encounter: Secondary | ICD-10-CM | POA: Insufficient documentation

## 2015-06-08 DIAGNOSIS — Z8742 Personal history of other diseases of the female genital tract: Secondary | ICD-10-CM | POA: Insufficient documentation

## 2015-06-08 DIAGNOSIS — Y9389 Activity, other specified: Secondary | ICD-10-CM | POA: Insufficient documentation

## 2015-06-08 DIAGNOSIS — Z880 Allergy status to penicillin: Secondary | ICD-10-CM | POA: Insufficient documentation

## 2015-06-08 DIAGNOSIS — Z794 Long term (current) use of insulin: Secondary | ICD-10-CM | POA: Diagnosis not present

## 2015-06-08 DIAGNOSIS — T754XXA Electrocution, initial encounter: Secondary | ICD-10-CM
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
## 2015-06-08 NOTE — ED Notes (Signed)
md at bedside  Pt alert and oriented x4. Respirations even and unlabored, bilateral symmetrical rise and fall of chest. Skin warm and dry. In no acute distress. Denies needs.   
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
## 2015-06-08 NOTE — Discharge Instructions (Signed)
Electric Shock Injury Take Tylenol as directed for discomfort. See your primary care physician if you continue to have discomfort in 2 or 3 days Electricity passing through your body can damage your skin and internal organs. Even just 50 volts of electricity may be enough to disrupt your heart's rhythm. A strong electric shock (high voltage) can harm your heart, muscles, and brain.  Household electricity usually ranges from 110-240 volts of alternating current. Most electric shock injuries that cause serious damage to the body are from a shock that is greater than 600 volts. A high-tension wire may be 100,000 volts or more. The severity of your injury depends on several factors, such as the voltage and the length of contact.  CAUSES Common causes of electrical injuries include:  Contact with electricity from wires or appliances in the home.  Children chewing on electric cords or playing with electric outlets.  Getting hit by lightning.  Getting an electrical injury at work.  Being injured by electricity from a high-voltage power line. SIGNS AND SYMPTOMS Signs and symptoms of electric shock may include:  Tingling and numbness.  Very bad pain.  Muscle spasms.  Skin burns (thermal burns).  Broken bones.  Head trauma.  Chest pain.  Heart palpitations.  Trouble breathing.  Headache.  Confusion.  Loss of memory.  Seizure. DIAGNOSIS  Your health care provider will diagnose electric shock injury based on your symptoms and your history of receiving a shock. Your health care provider will also do a physical exam. This may include tests to determine how badly you have been injured. You may have:  Blood tests to check:  Your blood cell counts (CBC).  The minerals in your blood (electrolyte panel).  For muscle or kidney damage.  The oxygen level of your blood.  Electrocardiogram (ECG).  Imaging studies including:  X-rays of your chest or spine or both.  Scans of  your internal organs, including ultrasound and CT scans. TREATMENT  Treatment for electric shock injuries depends on the type of injury you have. Emergency treatment may include:  Intravenous (IV) fluids and medicines to support blood pressure.  Oxygen and breathing support if necessary.  Burn care.  Keeping the neck and spine from moving if there is any chance of a spine fracture.  Care for any broken bones or head injuries.  Long-term treatment may include surgery to treat broken bones or severe burns. HOME CARE INSTRUCTIONS How you care for yourself at home after an electric shock injury will depend on the injuries you have sustained. Follow closely any directions given to you by your emergency room or hospital health care provider. Be sure to:  Keep all your follow-up visits as directed by your health care provider. This is important.  Take medicines only as directed by your health care provider. PREVENTION  To prevent electric shock injuries in the future:  Follow the manufacturer's instructions and precautions when using a home electric appliance.  Keep electrical appliances away from the tub or shower.  Keep electric cords out of reach of children.  Do not touch wet surfaces, faucets, or water pipes while using an electric appliance.  Use safety plugs in all electric outlets. SEEK MEDICAL CARE IF:   You have new symptoms.  Your symptoms change. SEEK IMMEDIATE MEDICAL CARE IF:  You have a seizure.  You have chest pain.  You have trouble breathing. MAKE SURE YOU:   Understand these instructions.  Will watch your condition.  Will get help right away if  you are not doing well or get worse.   This information is not intended to replace advice given to you by your health care provider. Make sure you discuss any questions you have with your health care provider.   Document Released: 08/16/2003 Document Revised: 09/03/2014 Document Reviewed: 11/09/2013 Elsevier  Interactive Patient Education [employer]

## 2015-07-10 ENCOUNTER — Encounter (HOSPITAL_COMMUNITY): Payer: Self-pay | Admitting: Emergency Medicine

## 2015-07-10 ENCOUNTER — Emergency Department (HOSPITAL_COMMUNITY)
Admission: EM | Admit: 2015-07-10 | Discharge: 2015-07-10 | Disposition: A | Discharge status: Home / Self Care | Payer: Medicaid Other | Attending: Emergency Medicine | Admitting: Emergency Medicine

## 2015-07-10 DIAGNOSIS — Z3202 Encounter for pregnancy test, result negative: Secondary | ICD-10-CM | POA: Insufficient documentation

## 2015-07-10 DIAGNOSIS — Z880 Allergy status to penicillin: Secondary | ICD-10-CM | POA: Insufficient documentation

## 2015-07-10 DIAGNOSIS — E119 Type 2 diabetes mellitus without complications: Secondary | ICD-10-CM | POA: Insufficient documentation

## 2015-07-10 DIAGNOSIS — R200 Anesthesia of skin: Secondary | ICD-10-CM | POA: Insufficient documentation

## 2015-07-10 DIAGNOSIS — Z9889 Other specified postprocedural states: Secondary | ICD-10-CM | POA: Diagnosis not present

## 2015-07-10 DIAGNOSIS — M79642 Pain in left hand: Secondary | ICD-10-CM | POA: Diagnosis not present

## 2015-07-10 DIAGNOSIS — N898 Other specified noninflammatory disorders of vagina: Secondary | ICD-10-CM | POA: Insufficient documentation

## 2015-07-10 DIAGNOSIS — Z794 Long term (current) use of insulin: Secondary | ICD-10-CM | POA: Insufficient documentation

## 2015-07-10 LAB — URINALYSIS, ROUTINE W REFLEX MICROSCOPIC
Bilirubin Urine: NEGATIVE
Glucose, UA: >1000 mg/dL — AB
KETONES UR: NEGATIVE mg/dL
NITRITE: NEGATIVE
PH: 6.5 (ref 5.0–8.0)
Protein, ur: NEGATIVE mg/dL
SPECIFIC GRAVITY, URINE: 1.042 — AB (ref 1.005–1.030)
Urobilinogen, UA: 1 mg/dL (ref 0.0–1.0)

## 2015-07-10 LAB — WET PREP, GENITAL
Trich, Wet Prep: NONE SEEN
Yeast Wet Prep HPF POC: NONE SEEN

## 2015-07-10 LAB — URINE MICROSCOPIC-ADD ON

## 2015-07-10 LAB — PREGNANCY, URINE: Preg Test, Ur: NEGATIVE

## 2015-07-10 MED ORDER — CLOTRIMAZOLE 1 % EX CREA: TOPICAL_CREAM | CUTANEOUS | Status: DC

## 2015-07-10 MED ORDER — METRONIDAZOLE 500 MG PO TABS: 500.0000 mg | ORAL_TABLET | Freq: Two times a day (BID) | ORAL | Status: DC

## 2015-07-10 NOTE — Discharge Instructions (Signed)
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
## 2015-07-11 LAB — RPR: RPR: NONREACTIVE

## 2015-07-11 LAB — GC/CHLAMYDIA PROBE AMP (~~LOC~~) NOT AT ARMC
CHLAMYDIA, DNA PROBE: NEGATIVE
Neisseria Gonorrhea: NEGATIVE

## 2015-07-11 LAB — HIV ANTIBODY (ROUTINE TESTING W REFLEX): HIV Screen 4th Generation wRfx: NONREACTIVE

## 2015-08-05 ENCOUNTER — Encounter (HOSPITAL_COMMUNITY): Payer: Self-pay | Admitting: *Deleted

## 2015-08-05 ENCOUNTER — Emergency Department (HOSPITAL_COMMUNITY)
Admission: EM | Admit: 2015-08-05 | Discharge: 2015-08-05 | Disposition: A | Discharge status: Home / Self Care | Payer: Medicaid Other | Attending: Emergency Medicine | Admitting: Emergency Medicine

## 2015-08-05 DIAGNOSIS — Z3202 Encounter for pregnancy test, result negative: Secondary | ICD-10-CM | POA: Insufficient documentation

## 2015-08-05 DIAGNOSIS — M542 Cervicalgia: Secondary | ICD-10-CM | POA: Insufficient documentation

## 2015-08-05 DIAGNOSIS — Z794 Long term (current) use of insulin: Secondary | ICD-10-CM | POA: Diagnosis not present

## 2015-08-05 DIAGNOSIS — R630 Anorexia: Secondary | ICD-10-CM | POA: Diagnosis not present

## 2015-08-05 DIAGNOSIS — R112 Nausea with vomiting, unspecified: Secondary | ICD-10-CM | POA: Diagnosis present

## 2015-08-05 DIAGNOSIS — N390 Urinary tract infection, site not specified: Secondary | ICD-10-CM

## 2015-08-05 DIAGNOSIS — E119 Type 2 diabetes mellitus without complications: Secondary | ICD-10-CM | POA: Insufficient documentation

## 2015-08-05 DIAGNOSIS — R51 Headache: Secondary | ICD-10-CM | POA: Insufficient documentation

## 2015-08-05 DIAGNOSIS — N926 Irregular menstruation, unspecified: Secondary | ICD-10-CM | POA: Diagnosis not present

## 2015-08-05 DIAGNOSIS — Z880 Allergy status to penicillin: Secondary | ICD-10-CM | POA: Diagnosis not present

## 2015-08-05 LAB — COMPREHENSIVE METABOLIC PANEL
ALBUMIN: 3.4 g/dL — AB (ref 3.5–5.0)
ALT: 27 U/L (ref 14–54)
AST: 22 U/L (ref 15–41)
Alkaline Phosphatase: 59 U/L (ref 38–126)
Anion gap: 8 (ref 5–15)
BILIRUBIN TOTAL: 0.7 mg/dL (ref 0.3–1.2)
BUN: 9 mg/dL (ref 6–20)
CHLORIDE: 101 mmol/L (ref 101–111)
CO2: 29 mmol/L (ref 22–32)
CREATININE: 0.77 mg/dL (ref 0.44–1.00)
Calcium: 8.7 mg/dL — ABNORMAL LOW (ref 8.9–10.3)
GFR calc Af Amer: >60 mL/min (ref >60)
GFR calc non Af Amer: >60 mL/min (ref >60)
GLUCOSE: 242 mg/dL — AB (ref 65–99)
POTASSIUM: 3.9 mmol/L (ref 3.5–5.1)
Sodium: 138 mmol/L (ref 135–145)
Total Protein: 6.7 g/dL (ref 6.5–8.1)

## 2015-08-05 LAB — URINALYSIS, ROUTINE W REFLEX MICROSCOPIC
BILIRUBIN URINE: NEGATIVE
GLUCOSE, UA: 100 mg/dL — AB
HGB URINE DIPSTICK: NEGATIVE
KETONES UR: NEGATIVE mg/dL
Nitrite: NEGATIVE
PH: 7 (ref 5.0–8.0)
Protein, ur: NEGATIVE mg/dL
Specific Gravity, Urine: 1.019 (ref 1.005–1.030)

## 2015-08-05 LAB — CBC
HEMATOCRIT: 46.7 % — AB (ref 36.0–46.0)
Hemoglobin: 15.5 g/dL — ABNORMAL HIGH (ref 12.0–15.0)
MCH: 28.5 pg (ref 26.0–34.0)
MCHC: 33.2 g/dL (ref 30.0–36.0)
MCV: 85.8 fL (ref 78.0–100.0)
PLATELETS: 315 10*3/uL (ref 150–400)
RBC: 5.44 MIL/uL — ABNORMAL HIGH (ref 3.87–5.11)
RDW: 13.7 % (ref 11.5–15.5)
WBC: 6.6 10*3/uL (ref 4.0–10.5)

## 2015-08-05 LAB — HCG, QUANTITATIVE, PREGNANCY: hCG, Beta Chain, Quant, S: 1 m[IU]/mL (ref <5)

## 2015-08-05 LAB — URINE MICROSCOPIC-ADD ON

## 2015-08-05 LAB — LIPASE, BLOOD: LIPASE: 21 U/L (ref 11–51)

## 2015-08-05 LAB — INFLUENZA PANEL BY PCR (TYPE A & B)
H1N1 flu by pcr: NOT DETECTED
Influenza A By PCR: NEGATIVE
Influenza B By PCR: NEGATIVE

## 2015-08-05 MED ORDER — ONDANSETRON HCL 8 MG PO TABS: 8.0000 mg | ORAL_TABLET | Freq: Three times a day (TID) | ORAL | Status: DC | PRN

## 2015-08-05 MED ORDER — ONDANSETRON HCL 4 MG PO TABS
8.0000 mg | ORAL_TABLET | Freq: Once | ORAL | Status: AC
  Administered 2015-08-05: 8 mg via ORAL
  Filled 2015-08-05: qty 2

## 2015-08-05 MED ORDER — CEPHALEXIN 500 MG PO CAPS
500.0000 mg | ORAL_CAPSULE | Freq: Once | ORAL | Status: AC
  Administered 2015-08-05: 500 mg via ORAL
  Filled 2015-08-05: qty 1

## 2015-08-05 MED ORDER — CEPHALEXIN 500 MG PO CAPS: 500.0000 mg | ORAL_CAPSULE | Freq: Four times a day (QID) | ORAL | Status: DC

## 2015-08-05 NOTE — Progress Notes (Signed)
Entered in [facility]   [facility] Schedule an appointment as soon as possible for a visit on 08/08/2015 This is your assigned Medicaid [place] access doctor If you prefer another contact DSS [phone] DSS assigned your doctor *You may receive a bill if you go to any family Dr not assigned to you [facility] [address] [place] [phone]   Medicaid [place] Access Covered Patient USE THIS WEBSITE TO ASSIST WITH UNDERSTANDING YOUR COVERAGE, RENEW APPLICATION [place] Medicaid Transportation to Dr appts if you are have full Medicaid: [phone], [phone] or [phone] Transportation Supervisor [phone] As a Medicaid client you MUST contact DSS/SSI each time you change address, move to another [place] county or another state to keep your address updated [place]: [phone] [address] [place], [place] [id number] [email or web address]

## 2015-08-05 NOTE — ED Notes (Signed)
Pt reports intermittent vomiting x2 weeks. Body aches since today.pain10/10.

## 2015-08-05 NOTE — ED Provider Notes (Signed)
CSN: [id number]     Arrival date & time 08/05/15  1642 History   First MD Initiated Contact with Patient 08/05/15 1843     Chief Complaint  Patient presents with  . Generalized Body Aches  . Emesis     (Consider location/radiation/quality/duration/timing/severity/associated sxs/prior Treatment) Patient is a 34 y.o. female presenting with general illness.  Illness Location:  Body Quality:  Aches, chills, fevers, vomiting Severity:  Moderate Duration:  4 days Timing:  Constant Progression:  Worsening Chronicity:  Recurrent Context:  After GI illness that resolved for a couple days Relieved by:  None Worsened by:  None Ineffective treatments:  None Associated symptoms: abdominal pain, congestion, cough, diarrhea, fever, headaches, myalgias, nausea and vomiting   Associated symptoms: no shortness of breath     Past Medical History  Diagnosis Date  . Diabetes mellitus without complication (HCC)   . Bacterial vaginosis    Past Surgical History  Procedure Laterality Date  . Cesarean section     History reviewed. No pertinent family history. Social History  Substance Use Topics  . Smoking status: Never Smoker   . Smokeless tobacco: Never Used  . Alcohol Use: Yes     Comment: socially   OB History    No data available     Review of Systems  Constitutional: Positive for fever, chills, activity change and appetite change.  HENT: Positive for congestion.   Respiratory: Positive for cough. Negative for shortness of breath.   Gastrointestinal: Positive for nausea, vomiting, abdominal pain and diarrhea. Negative for blood in stool.  Genitourinary: Positive for dysuria and menstrual problem.  Musculoskeletal: Positive for myalgias, back pain, arthralgias and neck pain.  Neurological: Positive for headaches. Negative for dizziness, seizures and light-headedness.  All other systems reviewed and are negative.     Allergies  Amoxicillin; Penicillins; and Reglan  Home  Medications   Prior to Admission medications   Medication Sig Start Date End Date Taking? Authorizing Provider  ibuprofen (ADVIL,MOTRIN) 200 MG tablet Take 400 mg by mouth every 6 (six) hours as needed for headache, mild pain or moderate pain.   Yes Historical Provider, MD  cephALEXin (KEFLEX) 500 MG capsule Take 1 capsule (500 mg total) by mouth 4 (four) times daily. 08/05/15   Marily MemosJason Amrita Radu, MD  insulin aspart (NOVOLOG) 100 UNIT/ML injection Inject 16 Units into the skin 3 (three) times daily. Patient not taking: Reported on 08/05/2015 05/19/15   Marlon Peliffany Greene, PA-C  Insulin Glargine (LANTUS SOLOSTAR) 100 UNIT/ML Solostar Pen Inject 27 Units into the skin at bedtime. Patient not taking: Reported on 08/05/2015 05/19/15   Marlon Peliffany Greene, PA-C  metroNIDAZOLE (FLAGYL) 500 MG tablet Take 1 tablet (500 mg total) by mouth 2 (two) times daily. One po bid x 7 days Patient not taking: Reported on 08/05/2015 07/10/15   Rolan BuccoMelanie Belfi, MD  ondansetron (ZOFRAN) 8 MG tablet Take 1 tablet (8 mg total) by mouth every 8 (eight) hours as needed for nausea or vomiting. 08/05/15   Marily MemosJason Glendell Schlottman, MD   BP 126/78 mmHg  Pulse 90  Temp(Src) 98.5 F (36.9 C) (Oral)  Resp 16  SpO2 100% Physical Exam  Constitutional: She is oriented to person, place, and time. She appears well-developed and well-nourished.  HENT:  Head: Normocephalic and atraumatic.  Eyes: Conjunctivae are normal. Pupils are equal, round, and reactive to light.  Neck: Normal range of motion.  Cardiovascular: Normal rate and regular rhythm.   Pulmonary/Chest: Effort normal. No stridor. No respiratory distress.  Abdominal: Soft. Bowel sounds  are normal. She exhibits no distension. There is no tenderness. There is no rebound.  Musculoskeletal: Normal range of motion. She exhibits no edema or tenderness.  Neurological: She is alert and oriented to person, place, and time.  Skin: Skin is warm and dry. No rash noted. No erythema.  Nursing note and vitals  reviewed.   ED Course  Procedures (including critical care time) Labs Review Labs Reviewed  COMPREHENSIVE METABOLIC PANEL - Abnormal; Notable for the following:    Glucose, Bld 242 (*)    Calcium 8.7 (*)    Albumin 3.4 (*)    All other components within normal limits  CBC - Abnormal; Notable for the following:    RBC 5.44 (*)    Hemoglobin 15.5 (*)    HCT 46.7 (*)    All other components within normal limits  URINALYSIS, ROUTINE W REFLEX MICROSCOPIC (NOT AT [facility]) - Abnormal; Notable for the following:    APPearance CLOUDY (*)    Glucose, UA 100 (*)    Leukocytes, UA MODERATE (*)    All other components within normal limits  URINE MICROSCOPIC-ADD ON - Abnormal; Notable for the following:    Squamous Epithelial / LPF 6-30 (*)    Bacteria, UA MANY (*)    All other components within normal limits  URINE CULTURE  LIPASE, BLOOD  HCG, QUANTITATIVE, PREGNANCY  INFLUENZA PANEL BY PCR (TYPE A & B, H1N1)  I-STAT BETA HCG BLOOD, ED ([facility], [facility], [facility] ONLY)    Imaging Review No results found. I have personally reviewed and evaluated these images and lab results as part of my medical decision-making.   EKG Interpretation None      MDM   Final diagnoses:  UTI (lower urinary tract infection)   34 yo F w/ influenza like illness and UTI. Also missed LMP, will check pregnancy. Will tx uti and add on influenza pcr.   Symptoms improved. Not pregnant. Stable for dc w/ abx.    Marily Memos, MD 08/05/15 [phone]

## 2015-08-05 NOTE — ED Notes (Signed)
Pt able to tolerate fluids---- denies nausea at this time.

## 2015-08-08 LAB — URINE CULTURE: Culture: >=100000

## 2015-10-03 ENCOUNTER — Emergency Department (HOSPITAL_COMMUNITY)
Admission: EM | Admit: 2015-10-03 | Discharge: 2015-10-03 | Disposition: A | Discharge status: Home / Self Care | Payer: Medicaid Other | Attending: Emergency Medicine | Admitting: Emergency Medicine

## 2015-10-03 ENCOUNTER — Encounter (HOSPITAL_COMMUNITY): Payer: Self-pay | Admitting: Emergency Medicine

## 2015-10-03 DIAGNOSIS — N898 Other specified noninflammatory disorders of vagina: Secondary | ICD-10-CM | POA: Diagnosis not present

## 2015-10-03 DIAGNOSIS — E119 Type 2 diabetes mellitus without complications: Secondary | ICD-10-CM | POA: Insufficient documentation

## 2015-10-03 DIAGNOSIS — R300 Dysuria: Secondary | ICD-10-CM | POA: Insufficient documentation

## 2015-10-03 NOTE — ED Notes (Signed)
Pt informed registration staff that she was leaving 

## 2015-10-03 NOTE — ED Notes (Signed)
Pt states she has had vaginal discharge and itching and dysuria x 2 weeks. Alert and oriented.

## 2015-10-05 ENCOUNTER — Emergency Department (HOSPITAL_COMMUNITY)
Admission: EM | Admit: 2015-10-05 | Discharge: 2015-10-05 | Disposition: A | Discharge status: Home / Self Care | Payer: Medicaid Other | Attending: Emergency Medicine | Admitting: Emergency Medicine

## 2015-10-05 ENCOUNTER — Encounter (HOSPITAL_COMMUNITY): Payer: Self-pay | Admitting: Emergency Medicine

## 2015-10-05 DIAGNOSIS — Z79899 Other long term (current) drug therapy: Secondary | ICD-10-CM | POA: Diagnosis not present

## 2015-10-05 DIAGNOSIS — N760 Acute vaginitis: Secondary | ICD-10-CM | POA: Diagnosis not present

## 2015-10-05 DIAGNOSIS — Z880 Allergy status to penicillin: Secondary | ICD-10-CM | POA: Insufficient documentation

## 2015-10-05 DIAGNOSIS — Z3202 Encounter for pregnancy test, result negative: Secondary | ICD-10-CM | POA: Insufficient documentation

## 2015-10-05 DIAGNOSIS — E119 Type 2 diabetes mellitus without complications: Secondary | ICD-10-CM | POA: Insufficient documentation

## 2015-10-05 DIAGNOSIS — N898 Other specified noninflammatory disorders of vagina: Secondary | ICD-10-CM | POA: Diagnosis present

## 2015-10-05 LAB — WET PREP, GENITAL
Sperm: NONE SEEN
Trich, Wet Prep: NONE SEEN
YEAST WET PREP: NONE SEEN

## 2015-10-05 LAB — URINE MICROSCOPIC-ADD ON

## 2015-10-05 LAB — URINALYSIS, ROUTINE W REFLEX MICROSCOPIC
BILIRUBIN URINE: NEGATIVE
Glucose, UA: >1000 mg/dL — AB
KETONES UR: NEGATIVE mg/dL
NITRITE: NEGATIVE
PH: 6 (ref 5.0–8.0)
Protein, ur: NEGATIVE mg/dL
Specific Gravity, Urine: 1.03 (ref 1.005–1.030)

## 2015-10-05 LAB — PREGNANCY, URINE: Preg Test, Ur: NEGATIVE

## 2015-10-05 MED ORDER — AZITHROMYCIN 250 MG PO TABS
1000.0000 mg | ORAL_TABLET | Freq: Once | ORAL | Status: AC
  Administered 2015-10-05: 1000 mg via ORAL
  Filled 2015-10-05: qty 4

## 2015-10-05 MED ORDER — SULFAMETHOXAZOLE-TRIMETHOPRIM 800-160 MG PO TABS: 1.0000 | ORAL_TABLET | Freq: Two times a day (BID) | ORAL | Status: AC

## 2015-10-05 MED ORDER — GENTAMICIN SULFATE 40 MG/ML IJ SOLN
240.0000 mg | Freq: Once | INTRAMUSCULAR | Status: AC
  Administered 2015-10-05: 240 mg via INTRAMUSCULAR
  Filled 2015-10-05: qty 6

## 2015-10-05 MED ORDER — CLOTRIMAZOLE 1 % VA CREA: 1.0000 | TOPICAL_CREAM | Freq: Every day | VAGINAL | Status: DC

## 2015-10-05 MED ORDER — METRONIDAZOLE 500 MG PO TABS: 500.0000 mg | ORAL_TABLET | Freq: Two times a day (BID) | ORAL | Status: DC

## 2015-10-05 NOTE — Discharge Instructions (Signed)
Bacterial Vaginosis °Bacterial vaginosis is a vaginal infection that occurs when the normal balance of bacteria in the vagina is disrupted. It results from an overgrowth of certain bacteria. This is the most common vaginal infection in women of childbearing age. Treatment is important to prevent complications, especially in pregnant women, as it can cause a premature delivery. °CAUSES  °Bacterial vaginosis is caused by an increase in harmful bacteria that are normally present in smaller amounts in the vagina. Several different kinds of bacteria can cause bacterial vaginosis. However, the reason that the condition develops is not fully understood. °RISK FACTORS °Certain activities or behaviors can put you at an increased risk of developing bacterial vaginosis, including: °· Having a new sex partner or multiple sex partners. °· Douching. °· Using an intrauterine device (IUD) for contraception. °Women do not get bacterial vaginosis from toilet seats, bedding, swimming pools, or contact with objects around them. °SIGNS AND SYMPTOMS  °Some women with bacterial vaginosis have no signs or symptoms. Common symptoms include: °· Grey vaginal discharge. °· A fishlike odor with discharge, especially after sexual intercourse. °· Itching or burning of the vagina and vulva. °· Burning or pain with urination. °DIAGNOSIS  °Your health care provider will take a medical history and examine the vagina for signs of bacterial vaginosis. A sample of vaginal fluid may be taken. Your health care provider will look at this sample under a microscope to check for bacteria and abnormal cells. A vaginal pH test may also be done.  °TREATMENT  °Bacterial vaginosis may be treated with antibiotic medicines. These may be given in the form of a pill or a vaginal cream. A second round of antibiotics may be prescribed if the condition comes back after treatment. Because bacterial vaginosis increases your risk for sexually transmitted diseases, getting  treated can help reduce your risk for chlamydia, gonorrhea, HIV, and herpes. °HOME CARE INSTRUCTIONS  °· Only take over-the-counter or prescription medicines as directed by your health care provider. °· If antibiotic medicine was prescribed, take it as directed. Make sure you finish it even if you start to feel better. °· Tell all sexual partners that you have a vaginal infection. They should see their health care provider and be treated if they have problems, such as a mild rash or itching. °· During treatment, it is important that you follow these instructions: °¨ Avoid sexual activity or use condoms correctly. °¨ Do not douche. °¨ Avoid alcohol as directed by your health care provider. °¨ Avoid breastfeeding as directed by your health care provider. °SEEK MEDICAL CARE IF:  °· Your symptoms are not improving after 3 days of treatment. °· You have increased discharge or pain. °· You have a fever. °MAKE SURE YOU:  °· Understand these instructions. °· Will watch your condition. °· Will get help right away if you are not doing well or get worse. °FOR MORE INFORMATION  °Centers for Disease Control and Prevention, Division of STD Prevention: www.cdc.gov/std °American Sexual Health Association (ASHA): www.ashastd.org  °  °This information is not intended to replace advice given to you by your health care provider. Make sure you discuss any questions you have with your health care provider. °  °Document Released: 08/13/2005 Document Revised: 09/03/2014 Document Reviewed: 03/25/2013 °Elsevier Interactive Patient Education ©2016 Elsevier Inc. ° ° ° °Vaginitis °Vaginitis is an inflammation of the vagina. It is most often caused by a change in the normal balance of the bacteria and yeast that live in the vagina. This change in balance   causes an overgrowth of certain bacteria or yeast, which causes the inflammation. There are different types of vaginitis, but the most common types are: °· Bacterial vaginosis. °· Yeast  infection (candidiasis). °· Trichomoniasis vaginitis. This is a sexually transmitted infection (STI). °· Viral vaginitis. °· Atrophic vaginitis. °· Allergic vaginitis. °CAUSES  °The cause depends on the type of vaginitis. Vaginitis can be caused by: °· Bacteria (bacterial vaginosis). °· Yeast (yeast infection). °· A parasite (trichomoniasis vaginitis) °· A virus (viral vaginitis). °· Low hormone levels (atrophic vaginitis). Low hormone levels can occur during pregnancy, breastfeeding, or after menopause. °· Irritants, such as bubble baths, scented tampons, and feminine sprays (allergic vaginitis). °Other factors can change the normal balance of the yeast and bacteria that live in the vagina. These include: °· Antibiotic medicines. °· Poor hygiene. °· Diaphragms, vaginal sponges, spermicides, birth control pills, and intrauterine devices (IUD). °· Sexual intercourse. °· Infection. °· Uncontrolled diabetes. °· A weakened immune system. °SYMPTOMS  °Symptoms can vary depending on the cause of the vaginitis. Common symptoms include: °· Abnormal vaginal discharge. °¨ The discharge is white, gray, or yellow with bacterial vaginosis. °¨ The discharge is thick, white, and cheesy with a yeast infection. °¨ The discharge is frothy and yellow or greenish with trichomoniasis. °· A bad vaginal odor. °¨ The odor is fishy with bacterial vaginosis. °· Vaginal itching, pain, or swelling. °· Painful intercourse. °· Pain or burning when urinating. °Sometimes, there are no symptoms. °TREATMENT  °Treatment will vary depending on the type of infection.  °· Bacterial vaginosis and trichomoniasis are often treated with antibiotic creams or pills. °· Yeast infections are often treated with antifungal medicines, such as vaginal creams or suppositories. °· Viral vaginitis has no cure, but symptoms can be treated with medicines that relieve discomfort. Your sexual partner should be treated as well. °· Atrophic vaginitis may be treated with an  estrogen cream, pill, suppository, or vaginal ring. If vaginal dryness occurs, lubricants and moisturizing creams may help. You may be told to avoid scented soaps, sprays, or douches. °· Allergic vaginitis treatment involves quitting the use of the product that is causing the problem. Vaginal creams can be used to treat the symptoms. °HOME CARE INSTRUCTIONS  °· Take all medicines as directed by your caregiver. °· Keep your genital area clean and dry. Avoid soap and only rinse the area with water. °· Avoid douching. It can remove the healthy bacteria in the vagina. °· Do not use tampons or have sexual intercourse until your vaginitis has been treated. Use sanitary pads while you have vaginitis. °· Wipe from front to back. This avoids the spread of bacteria from the rectum to the vagina. °· Let air reach your genital area. °¨ Wear cotton underwear to decrease moisture buildup. °¨ Avoid wearing underwear while you sleep until your vaginitis is gone. °¨ Avoid tight pants and underwear or nylons without a cotton panel. °¨ Take off wet clothing (especially bathing suits) as soon as possible. °· Use mild, non-scented products. Avoid using irritants, such as: °¨ Scented feminine sprays. °¨ Fabric softeners. °¨ Scented detergents. °¨ Scented tampons. °¨ Scented soaps or bubble baths. °· Practice safe sex and use condoms. Condoms may prevent the spread of trichomoniasis and viral vaginitis. °SEEK MEDICAL CARE IF:  °· You have abdominal pain. °· You have a fever or persistent symptoms for more than 2-3 days. °· You have a fever and your symptoms suddenly get worse. °  °This information is not intended to replace advice given   to you by your health care provider. Make sure you discuss any questions you have with your health care provider. °  °Document Released: 06/10/2007 Document Revised: 12/28/2014 Document Reviewed: 01/24/2012 °Elsevier Interactive Patient Education ©2016 Elsevier Inc. ° °

## 2015-10-05 NOTE — ED Provider Notes (Signed)
CSN: [id number]     Arrival date & time 10/05/15  2148 History   First MD Initiated Contact with Patient 10/05/15 2219     Chief Complaint  Patient presents with  . Dysuria  . Vaginal Discharge    HPI Patient presents to the emergency room for evaluation of vaginal discharge itching and dysuria. Symptoms started about 2 weeks ago. She first noticed after new sexual partner. Patient has a lot of external vaginal itching and irritation. It is burning and painful and that seems to be getting more severe. She also has noticed dysuria and urinary frequency. She denies any vomiting or diarrhea. No fevers or chills. She does not have a primary doctor and was not able to follow up with anyone other than the emergency room. Past Medical History  Diagnosis Date  . Diabetes mellitus without complication (HCC)   . Bacterial vaginosis    Past Surgical History  Procedure Laterality Date  . Cesarean section     History reviewed. No pertinent family history. Social History  Substance Use Topics  . Smoking status: Never Smoker   . Smokeless tobacco: Never Used  . Alcohol Use: Yes     Comment: socially   OB History    No data available     Review of Systems  All other systems reviewed and are negative.     Allergies  Amoxicillin; Penicillins; and Reglan  Home Medications   Prior to Admission medications   Medication Sig Start Date End Date Taking? Authorizing Provider  ibuprofen (ADVIL,MOTRIN) 200 MG tablet Take 400 mg by mouth every 6 (six) hours as needed for headache, mild pain or moderate pain.   Yes Historical Provider, MD  cephALEXin (KEFLEX) 500 MG capsule Take 1 capsule (500 mg total) by mouth 4 (four) times daily. Patient not taking: Reported on 10/05/2015 08/05/15   Marily Memos, MD  clotrimazole (GYNE-LOTRIMIN) 1 % vaginal cream Place 1 Applicatorful vaginally at bedtime. 10/05/15   Linwood Dibbles, MD  insulin aspart (NOVOLOG) 100 UNIT/ML injection Inject 16 Units into the skin 3  (three) times daily. Patient not taking: Reported on 08/05/2015 05/19/15   Marlon Pel, PA-C  Insulin Glargine (LANTUS SOLOSTAR) 100 UNIT/ML Solostar Pen Inject 27 Units into the skin at bedtime. Patient not taking: Reported on 08/05/2015 05/19/15   Marlon Pel, PA-C  metroNIDAZOLE (FLAGYL) 500 MG tablet Take 1 tablet (500 mg total) by mouth 2 (two) times daily. 10/05/15   Linwood Dibbles, MD  ondansetron (ZOFRAN) 8 MG tablet Take 1 tablet (8 mg total) by mouth every 8 (eight) hours as needed for nausea or vomiting. Patient not taking: Reported on 10/05/2015 08/05/15   Marily Memos, MD  sulfamethoxazole-trimethoprim (BACTRIM DS,SEPTRA DS) 800-160 MG tablet Take 1 tablet by mouth 2 (two) times daily. 10/05/15 10/12/15  Linwood Dibbles, MD   BP 99/66 mmHg  Pulse 100  Temp(Src) 97.4 F (36.3 C) (Oral)  Resp 16  SpO2 97%  LMP 09/26/2015 (Approximate) Physical Exam  Constitutional: She appears well-developed and well-nourished. No distress.  HENT:  Head: Normocephalic and atraumatic.  Right Ear: External ear normal.  Left Ear: External ear normal.  Eyes: Conjunctivae are normal. Right eye exhibits no discharge. Left eye exhibits no discharge. No scleral icterus.  Neck: Neck supple. No tracheal deviation present.  Cardiovascular: Normal rate.   Pulmonary/Chest: Effort normal. No stridor. No respiratory distress.  Genitourinary: There is rash on the right labia. There is rash on the left labia. Uterus is not tender. Cervix exhibits discharge.  Cervix exhibits no motion tenderness and no friability. Right adnexum displays no mass and no tenderness. Left adnexum displays no mass and no tenderness. Vaginal discharge found.  Erythema of the perineum and labia with whitish exudate, white vaginal discharge  Musculoskeletal: She exhibits no edema.  Neurological: She is alert. Cranial nerve deficit: no gross deficits.  Skin: Skin is warm and dry. No rash noted.  Psychiatric: She has a normal mood and affect.  Nursing  note and vitals reviewed.   ED Course  Procedures (including critical care time) Labs Review Labs Reviewed  WET PREP, GENITAL - Abnormal; Notable for the following:    Clue Cells Wet Prep HPF POC MANY (*)    WBC, Wet Prep HPF POC FEW (*)    All other components within normal limits  URINALYSIS, ROUTINE W REFLEX MICROSCOPIC (NOT AT [facility]) - Abnormal; Notable for the following:    APPearance CLOUDY (*)    Glucose, UA >1000 (*)    Hgb urine dipstick TRACE (*)    Leukocytes, UA MODERATE (*)    All other components within normal limits  URINE MICROSCOPIC-ADD ON - Abnormal; Notable for the following:    Squamous Epithelial / LPF 6-30 (*)    Bacteria, UA MANY (*)    All other components within normal limits  RPR  HIV ANTIBODY (ROUTINE TESTING)  PREGNANCY, URINE  GC/CHLAMYDIA PROBE AMP ([place]) NOT AT [facility]   Medications  azithromycin (ZITHROMAX) tablet 1,000 mg (1,000 mg Oral Given 10/05/15 2315)  gentamicin (GARAMYCIN) injection 240 mg (240 mg Intramuscular Given 10/05/15 2315)     MDM   Final diagnoses:  Vaginitis    UA is abnormal, possible UTI although a clean catch specimen and she is having dc.  Will treat for BV and yeast because clinically it is more suggestive of yeast although none noted on the wet prep.  Empirically covered for STD.  [facility]    Linwood Dibbles, MD 10/05/15 2319

## 2015-10-05 NOTE — ED Notes (Signed)
Pt states she had dysuria, urinary frequency, and white vaginal discharge

## 2015-10-06 LAB — RPR: RPR: NONREACTIVE

## 2015-10-06 LAB — HIV ANTIBODY (ROUTINE TESTING W REFLEX): HIV SCREEN 4TH GENERATION: NONREACTIVE

## 2015-10-07 LAB — GC/CHLAMYDIA PROBE AMP (~~LOC~~) NOT AT ARMC
CHLAMYDIA, DNA PROBE: NEGATIVE
NEISSERIA GONORRHEA: NEGATIVE

## 2015-10-12 ENCOUNTER — Telehealth (HOSPITAL_BASED_OUTPATIENT_CLINIC_OR_DEPARTMENT_OTHER): Payer: Self-pay | Admitting: Emergency Medicine

## 2015-11-07 ENCOUNTER — Encounter (HOSPITAL_COMMUNITY): Payer: Self-pay | Admitting: Emergency Medicine

## 2015-11-07 ENCOUNTER — Emergency Department (HOSPITAL_COMMUNITY)
Admission: EM | Admit: 2015-11-07 | Discharge: 2015-11-07 | Disposition: A | Discharge status: Home / Self Care | Payer: Medicaid Other | Attending: Emergency Medicine | Admitting: Emergency Medicine

## 2015-11-07 ENCOUNTER — Emergency Department (HOSPITAL_COMMUNITY): Payer: Medicaid Other

## 2015-11-07 DIAGNOSIS — H538 Other visual disturbances: Secondary | ICD-10-CM | POA: Insufficient documentation

## 2015-11-07 DIAGNOSIS — Z880 Allergy status to penicillin: Secondary | ICD-10-CM | POA: Diagnosis not present

## 2015-11-07 DIAGNOSIS — S0993XA Unspecified injury of face, initial encounter: Secondary | ICD-10-CM | POA: Diagnosis present

## 2015-11-07 DIAGNOSIS — Y9389 Activity, other specified: Secondary | ICD-10-CM | POA: Diagnosis not present

## 2015-11-07 DIAGNOSIS — S0083XA Contusion of other part of head, initial encounter: Secondary | ICD-10-CM | POA: Diagnosis not present

## 2015-11-07 DIAGNOSIS — S0230XA Fracture of orbital floor, unspecified side, initial encounter for closed fracture: Secondary | ICD-10-CM

## 2015-11-07 DIAGNOSIS — E119 Type 2 diabetes mellitus without complications: Secondary | ICD-10-CM | POA: Insufficient documentation

## 2015-11-07 DIAGNOSIS — Z792 Long term (current) use of antibiotics: Secondary | ICD-10-CM | POA: Diagnosis not present

## 2015-11-07 DIAGNOSIS — Y9289 Other specified places as the place of occurrence of the external cause: Secondary | ICD-10-CM | POA: Insufficient documentation

## 2015-11-07 DIAGNOSIS — Y998 Other external cause status: Secondary | ICD-10-CM | POA: Insufficient documentation

## 2015-11-07 DIAGNOSIS — S0232XA Fracture of orbital floor, left side, initial encounter for closed fracture: Secondary | ICD-10-CM | POA: Insufficient documentation

## 2015-11-07 DIAGNOSIS — Z8742 Personal history of other diseases of the female genital tract: Secondary | ICD-10-CM | POA: Insufficient documentation

## 2015-11-07 MED ORDER — HYDROCODONE-ACETAMINOPHEN 5-325 MG PO TABS: 1.0000 | ORAL_TABLET | ORAL | Status: DC | PRN
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
## 2015-11-07 NOTE — Discharge Instructions (Signed)
Orbital Floor Fracture, Non-Blowout The eye sits in the part of the skull called the "orbit." The upper and outside walls of the orbit are thick and strong. The inside wall (near the nose) and the orbital floor are very thin and weak. The tissues around the eye will briefly press together if there is a direct blow to the front of the eye. This leads to high pressure against the orbital walls. The inside wall and the orbital floor may break since these are the weakest walls. If the orbital floor breaks, the tissues around the eye, including the muscle that makes the eye look down, may become trapped in the sinus below when the orbital floor "blows out." If a blowout does not happen, the orbital floor fracture is considered a non-blowout orbital fracture. CAUSES An orbital floor fracture can be caused by any accident in which an object hits the face or the face strikes against a hard object. The most common ways that people break their eye socket include:  Being hit by a blunt object, such as a baseball bat or a fist.  Striking the face on the car dashboard during a crash.  Falls.  Gunshot. SYMPTOMS  If there has been no injury to the eye itself, symptoms may include:  Puffiness (swelling) and bruising around the eye area (black eye).  Numbness of the cheek and upper gum on the side with the floor fracture. This is caused by nerve injury to these areas.  Pain around the eye.  Headache.  Ear pain on the injured side. DIAGNOSIS The diagnosis of an orbital floor fracture is suspected during an eye exam by an ophthalmologist. It is confirmed by X-rays or CT scan. TREATMENT Your caregiver may suggest waiting 1 or 2 weeks for the swelling to go away before examining the eye. When the swelling lessens, your caregiver will examine the eye to see if there is any sign of a trapped muscle or double vision when looking in different directions. If double vision is not found and muscle or tissue did not  get trapped, no further treatment is necessary. After that, in almost all cases, the bones heal together on their own.  HOME CARE INSTRUCTIONS  Take all pain medicine as directed by your caregiver.  Use ice packs or other cold therapy to reduce swelling as directed by your caregiver.  Do not put a contact lens in the injured eye until your caregiver approves.  Avoid dusty environments.  Always wear protective glasses or goggles when recommended. Wearing protective eyewear is not dangerous to your injured eye and will not delay healing.  As long as your other eye is seeing normally, you may return to work and drive.  You may travel by plane or be in high altitudes. However, your swelling may take longer to go away, and you may have sinus pain.  Be aware that your depth perception and your ability to judge distance may be reduced or lost. SEEK IMMEDIATE MEDICAL CARE IF:  Your vision changes.  Your redness or swelling persists around the injured eye or gets worse.  You start to have double vision.  You have a bloody or discolored discharge from your nose.  You have a fever that lasts longer than 2 to 3 days.  You have a fever that suddenly gets worse.  Your cheek or upper gum numbness does not go away. MAKE SURE YOU:  Understand these instructions.  Will watch your condition.  Will get help right away if  that suddenly gets worse.  · Your cheek or upper gum numbness does not go away.  MAKE SURE YOU:  · Understand these instructions.  · Will watch your condition.  · Will get help right away if you are not doing well or get worse.     This information is not intended to replace advice given to you by your health care provider. Make sure you discuss any questions you have with your health care provider.     Document Released: 11/05/2011 Document Revised: 09/03/2014 Document Reviewed: 11/05/2011  Elsevier Interactive Patient Education ©2016 Elsevier Inc.

## 2015-11-09 DIAGNOSIS — IMO0002 Reserved for concepts with insufficient information to code with codable children: Secondary | ICD-10-CM

## 2015-11-09 DIAGNOSIS — S0230XD Fracture of orbital floor, unspecified side, subsequent encounter for fracture with routine healing: Secondary | ICD-10-CM
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
## 2015-12-29 ENCOUNTER — Emergency Department (HOSPITAL_COMMUNITY)
Admission: EM | Admit: 2015-12-29 | Discharge: 2015-12-29 | Disposition: A | Discharge status: Home / Self Care | Payer: Medicaid Other | Attending: Emergency Medicine | Admitting: Emergency Medicine

## 2015-12-29 ENCOUNTER — Emergency Department (HOSPITAL_COMMUNITY): Payer: Medicaid Other

## 2015-12-29 ENCOUNTER — Encounter (HOSPITAL_COMMUNITY): Payer: Self-pay

## 2015-12-29 DIAGNOSIS — Y998 Other external cause status: Secondary | ICD-10-CM | POA: Insufficient documentation

## 2015-12-29 DIAGNOSIS — E119 Type 2 diabetes mellitus without complications: Secondary | ICD-10-CM | POA: Diagnosis not present

## 2015-12-29 DIAGNOSIS — S63502A Unspecified sprain of left wrist, initial encounter: Secondary | ICD-10-CM | POA: Insufficient documentation

## 2015-12-29 DIAGNOSIS — W1839XA Other fall on same level, initial encounter: Secondary | ICD-10-CM | POA: Insufficient documentation

## 2015-12-29 DIAGNOSIS — Y9289 Other specified places as the place of occurrence of the external cause: Secondary | ICD-10-CM | POA: Diagnosis not present

## 2015-12-29 DIAGNOSIS — Z8742 Personal history of other diseases of the female genital tract: Secondary | ICD-10-CM | POA: Diagnosis not present

## 2015-12-29 DIAGNOSIS — Y9389 Activity, other specified: Secondary | ICD-10-CM | POA: Insufficient documentation

## 2015-12-29 DIAGNOSIS — Z79899 Other long term (current) drug therapy: Secondary | ICD-10-CM | POA: Diagnosis not present

## 2015-12-29 DIAGNOSIS — Z792 Long term (current) use of antibiotics: Secondary | ICD-10-CM | POA: Insufficient documentation

## 2015-12-29 DIAGNOSIS — Z880 Allergy status to penicillin: Secondary | ICD-10-CM | POA: Diagnosis not present

## 2015-12-29 DIAGNOSIS — S6992XA Unspecified injury of left wrist, hand and finger(s), initial encounter: Secondary | ICD-10-CM | POA: Diagnosis present

## 2015-12-29 MED ORDER — TRAMADOL HCL 50 MG PO TABS: 50.0000 mg | ORAL_TABLET | Freq: Four times a day (QID) | ORAL | Status: DC | PRN

## 2015-12-29 MED ORDER — OXYCODONE-ACETAMINOPHEN 5-325 MG PO TABS
2.0000 | ORAL_TABLET | Freq: Once | ORAL | Status: AC
  Administered 2015-12-29: 2 via ORAL
  Filled 2015-12-29: qty 2

## 2015-12-29 MED ORDER — NAPROXEN 500 MG PO TABS: 500.0000 mg | ORAL_TABLET | Freq: Two times a day (BID) | ORAL | Status: DC
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
## 2015-12-29 NOTE — Discharge Instructions (Signed)
RICE for Routine Care of Injuries Theroutine careofmanyinjuriesincludes rest, ice, compression, and elevation (RICE therapy). RICE therapy is often recommended for injuries to soft tissues, such as a muscle strain, ligament injuries, bruises, and overuse injuries. It can also be used for some bony injuries. Using RICE therapy can help to relieve pain, lessen swelling, and enable your body to heal. Rest Rest is required to allow your body to heal. This usually involves reducing your normal activities and avoiding use of the injured part of your body. Generally, you can return to your normal activities when you are comfortable and have been given permission by your health care provider. Ice Icing your injury helps to keep the swelling down, and it lessens pain. Do not apply ice directly to your skin.  Put ice in a plastic bag.  Place a towel between your skin and the bag.  Leave the ice on for 20 minutes, 2-3 times a day. Do this for as long as you are directed by your health care provider. Compression Compression means putting pressure on the injured area. Compression helps to keep swelling down, gives support, and helps with discomfort. Compression may be done with an elastic bandage. If an elastic bandage has been applied, follow these general tips:  Remove and reapply the bandage every 3-4 hours or as directed by your health care provider.  Make sure the bandage is not wrapped too tightly, because this can cut off circulation. If part of your body beyond the bandage becomes blue, numb, cold, swollen, or more painful, your bandage is most likely too tight. If this occurs, remove your bandage and reapply it more loosely.  See your health care provider if the bandage seems to be making your problems worse rather than better. Elevation Elevation means keeping the injured area raised. This helps to lessen swelling and decrease pain. If possible, your injured area should be elevated at or  above the level of your heart or the center of your chest. WHEN SHOULD I SEEK MEDICAL CARE? You should seek medical care if:  Your pain and swelling continue.  Your symptoms are getting worse rather than improving. These symptoms may indicate that further evaluation or further X-rays are needed. Sometimes, X-rays may not show a small broken bone (fracture) until a number of days later. Make a follow-up appointment with your health care provider. WHEN SHOULD I SEEK IMMEDIATE MEDICAL CARE? You should seek immediate medical care if:  You have sudden severe pain at or below the area of your injury.  You have redness or increased swelling around your injury.  You have tingling or numbness at or below the area of your injury that does not improve after you remove the elastic bandage.   This information is not intended to replace advice given to you by your health care provider. Make sure you discuss any questions you have with your health care provider.   Document Released: 11/25/2000 Document Revised: 05/04/2015 Document Reviewed: 07/21/2014 Elsevier Interactive Patient Education 2016 [employer] Wrist Sprain A wrist sprain is a stretch or tear in the strong, fibrous tissues (ligaments) that connect your wrist bones. The ligaments of your wrist may be easily sprained. There are three types of wrist sprains.  Grade 1. The ligament is not stretched or torn, but the sprain causes pain.  Grade 2. The ligament is stretched or partially torn. You may be able to move your wrist, but not very much.  Grade 3. The ligament or muscle completely tears. You may  find it difficult or extremely painful to move your wrist even a little. CAUSES Often, wrist sprains are a result of a fall or an injury. The force of the impact causes the fibers of your ligament to stretch too much or tear. Common causes of wrist sprains include:  Overextending your wrist while catching a ball with your hands.  Repetitive  or strenuous extension or bending of your wrist.  Landing on your hand during a fall. RISK FACTORS  Having previous wrist injuries.  Playing contact sports, such as boxing or wrestling.  Participating in activities in which falling is common.  Having poor wrist strength and flexibility. SIGNS AND SYMPTOMS  Wrist pain.  Wrist tenderness.  Inflammation or bruising of the wrist area.  Hearing a "pop" or feeling a tear at the time of the injury.  Decreased wrist movement due to pain, stiffness, or weakness. DIAGNOSIS Your health care provider will examine your wrist. In some cases, an X-ray will be taken to make sure you did not break any bones. If your health care provider thinks that you tore a ligament, he or she may order an MRI of your wrist. TREATMENT Treatment involves resting and icing your wrist. You may also need to take pain medicines to help lessen pain and inflammation. Your health care provider may recommend keeping your wrist still (immobilized) with a splint to help your sprain heal. When the splint is no longer necessary, you may need to perform strengthening and stretching exercises. These exercises help you to regain strength and full range of motion in your wrist. Surgery is not usually needed for wrist sprains unless the ligament completely tears. HOME CARE INSTRUCTIONS  Rest your wrist. Do not do things that cause pain.  Wear your wrist splint as directed by your health care provider.  Take medicines only as directed by your health care provider.  To ease pain and swelling, apply ice to the injured area.  Put ice in a plastic bag.  Place a towel between your skin and the bag.  Leave the ice on for 20 minutes, 2-3 times a day. SEEK MEDICAL CARE IF:  Your pain, discomfort, or swelling gets worse even with treatment.  You feel sudden numbness in your hand.   This information is not intended to replace advice given to you by your health care provider.  Make sure you discuss any questions you have with your health care provider.   Document Released: 04/16/2014 Document Reviewed: 04/16/2014 Elsevier Interactive Patient Education [employer]

## 2015-12-29 NOTE — ED Provider Notes (Signed)
CSN: [id number]     Arrival date & time 12/29/15  1929 History  By signing my name below, I, Kara Hayes, attest that this documentation has been prepared under the direction and in the presence of non-physician practitioner, Antony MaduraKelly Zuleika Gallus, PA-C. Electronically Signed: Marisue HumbleMichelle Hayes, Scribe. 12/29/2015. 10:22 PM.    Chief Complaint  Patient presents with  . Arm Pain   The history is provided by the patient. No language interpreter was used.   HPI Comments:  Kara Hayes is a 35 y.o. female with PMHx of DM who presents to the Emergency Department complaining of worsening, moderate left arm pain s/p fall this morning. Pain began at left wrist and radiated up to her elbow. Pt has taken Tylenol and applied ice without relief. No alleviating factors noted. Pt is right handed. Denies head injury or syncope.  Past Medical History  Diagnosis Date  . Diabetes mellitus without complication (HCC)   . Bacterial vaginosis    Past Surgical History  Procedure Laterality Date  . Cesarean section     History reviewed. No pertinent family history. Social History  Substance Use Topics  . Smoking status: Never Smoker   . Smokeless tobacco: Never Used  . Alcohol Use: Yes     Comment: socially   OB History    No data available      Review of Systems  Musculoskeletal: Positive for myalgias and arthralgias (left wrist).  Neurological: Negative for syncope.  All other systems reviewed and are negative.   Allergies  Amoxicillin; Penicillins; and Reglan  Home Medications   Prior to Admission medications   Medication Sig Start Date End Date Taking? Authorizing Provider  cephALEXin (KEFLEX) 500 MG capsule Take 1 capsule (500 mg total) by mouth 4 (four) times daily. Patient not taking: Reported on 10/05/2015 08/05/15   Marily MemosJason Mesner, MD  clotrimazole (GYNE-LOTRIMIN) 1 % vaginal cream Place 1 Applicatorful vaginally at bedtime. 10/05/15   Linwood DibblesJon Knapp, MD  HYDROcodone-acetaminophen (NORCO/VICODIN)  5-325 MG tablet Take 1-2 tablets by mouth every 4 (four) hours as needed. 11/07/15   Lorre NickAnthony Allen, MD  ibuprofen (ADVIL,MOTRIN) 200 MG tablet Take 400 mg by mouth every 6 (six) hours as needed for headache, mild pain or moderate pain.    Historical Provider, MD  insulin aspart (NOVOLOG) 100 UNIT/ML injection Inject 16 Units into the skin 3 (three) times daily. Patient not taking: Reported on 08/05/2015 05/19/15   Marlon Peliffany Greene, PA-C  Insulin Glargine (LANTUS SOLOSTAR) 100 UNIT/ML Solostar Pen Inject 27 Units into the skin at bedtime. Patient not taking: Reported on 08/05/2015 05/19/15   Marlon Peliffany Greene, PA-C  metroNIDAZOLE (FLAGYL) 500 MG tablet Take 1 tablet (500 mg total) by mouth 2 (two) times daily. 10/05/15   Linwood DibblesJon Knapp, MD  naproxen (NAPROSYN) 500 MG tablet Take 1 tablet (500 mg total) by mouth 2 (two) times daily. 12/29/15   Antony MaduraKelly Raydel Hosick, PA-C  ondansetron (ZOFRAN) 8 MG tablet Take 1 tablet (8 mg total) by mouth every 8 (eight) hours as needed for nausea or vomiting. Patient not taking: Reported on 11/07/2015 08/05/15   Marily MemosJason Mesner, MD  traMADol (ULTRAM) 50 MG tablet Take 1 tablet (50 mg total) by mouth every 6 (six) hours as needed for severe pain. 12/29/15   Antony MaduraKelly Fred Hammes, PA-C   BP 119/74 mmHg  Pulse 93  Temp(Src) 98.2 F (36.8 C) (Oral)  Resp 15  Ht 5\' 6"  (1.676 m)  Wt 107.049 kg  BMI 38.11 kg/m2  SpO2 100%   Physical Exam  Constitutional:  She is oriented to person, place, and time. She appears well-developed and well-nourished. No distress.  Nontoxic-appearing  HENT:  Head: Normocephalic and atraumatic.  Eyes: Conjunctivae and EOM are normal. No scleral icterus.  Neck: Normal range of motion.  Cardiovascular: Normal rate, regular rhythm and intact distal pulses.   Distal radial pulse 2+ in the left upper extremity.  Pulmonary/Chest: Effort normal. No respiratory distress.  Respirations even and unlabored  Musculoskeletal: Normal range of motion.       Left wrist: She exhibits  tenderness, bony tenderness and swelling (mild). She exhibits normal range of motion, no effusion, no crepitus and no deformity.  Tenderness to palpation to the distal radius and anatomical snuffbox on the left. No bony deformity or crepitus. No effusion. Passive range of motion of the left wrist is preserved.  Neurological: She is alert and oriented to person, place, and time. She exhibits normal muscle tone. Coordination normal.  Sensation to light touch intact in the left upper extremity and hand. Patient able to wiggle all fingers. Good grip strength.  Skin: Skin is warm and dry. No rash noted. She is not diaphoretic. No erythema. No pallor.  Psychiatric: She has a normal mood and affect. Her behavior is normal.  Nursing note and vitals reviewed.   ED Course  Procedures  DIAGNOSTIC STUDIES:  Oxygen Saturation is 100% on RA, normal by my interpretation.    COORDINATION OF CARE:  10:18 PM Will order x-ray of left wrist. Discussed treatment plan with pt at bedside and pt agreed to plan.  Labs Review Labs Reviewed - No data to display  Imaging Review Dg Forearm Left  12/29/2015  CLINICAL DATA:  Patient complains of left forearm pain after fall today. EXAM: LEFT FOREARM - 2 VIEW COMPARISON:  None. FINDINGS: There is no evidence of fracture or other focal bone lesions. Soft tissues are unremarkable. IMPRESSION: Negative. Electronically Signed   By: Esperanza Heir M.D.   On: 12/29/2015 20:56   Dg Wrist Complete Left  12/29/2015  CLINICAL DATA:  Patient fell this morning with hyperflexion injury to the wrist. Pain radial side of wrist. Snuffbox tenderness. EXAM: LEFT WRIST - COMPLETE 3+ VIEW COMPARISON:  None. FINDINGS: There is no evidence of fracture or dislocation. There is no evidence of arthropathy or other focal bone abnormality. Soft tissues are unremarkable. IMPRESSION: Negative. Electronically Signed   By: Burman Nieves M.D.   On: 12/29/2015 22:36   I have personally reviewed and  evaluated these images and lab results as part of my medical decision-making.   EKG Interpretation None      MDM   Final diagnoses:  Wrist sprain, left, initial encounter    35 year old female sent to the emergency department for evaluation of left wrist and arm pain after a FOOH today. She is neurovascularly intact. No crepitus or warm at 8. X-rays negative for fracture. Symptoms consistent with wrist sprain. Patient placed in thumb spica brace for support. Will start on NSAIDs and have encouraged icing. Return precautions discussed and provided. Patient discharged in good condition with no unaddressed concerns.  I personally performed the services described in this documentation, which was scribed in my presence. The recorded information has been reviewed and is accurate.    Filed Vitals:   12/29/15 2017  BP: 119/74  Pulse: 93  Temp: 98.2 F (36.8 C)  TempSrc: Oral  Resp: 15  Height: 5\' 6"  (1.676 m)  Weight: 107.049 kg  SpO2: 100%      Antony Madura,  PA-C 12/29/15 2317  Rolland Porter, MD 01/02/16 [phone]

## 2017-07-02 ENCOUNTER — Emergency Department (HOSPITAL_COMMUNITY)
Admission: EM | Admit: 2017-07-02 | Discharge: 2017-07-02 | Disposition: A | Discharge status: Home / Self Care | Payer: Medicaid Other | Attending: Emergency Medicine | Admitting: Emergency Medicine

## 2017-07-02 ENCOUNTER — Encounter (HOSPITAL_COMMUNITY): Payer: Self-pay | Admitting: Emergency Medicine

## 2017-07-02 ENCOUNTER — Other Ambulatory Visit: Payer: Self-pay

## 2017-07-02 DIAGNOSIS — Y939 Activity, unspecified: Secondary | ICD-10-CM | POA: Diagnosis not present

## 2017-07-02 DIAGNOSIS — F410 Panic disorder [episodic paroxysmal anxiety] without agoraphobia: Secondary | ICD-10-CM | POA: Diagnosis not present

## 2017-07-02 DIAGNOSIS — S01511A Laceration without foreign body of lip, initial encounter: Secondary | ICD-10-CM | POA: Diagnosis present

## 2017-07-02 DIAGNOSIS — R350 Frequency of micturition: Secondary | ICD-10-CM | POA: Insufficient documentation

## 2017-07-02 DIAGNOSIS — E119 Type 2 diabetes mellitus without complications: Secondary | ICD-10-CM | POA: Insufficient documentation

## 2017-07-02 DIAGNOSIS — Y929 Unspecified place or not applicable: Secondary | ICD-10-CM | POA: Diagnosis not present

## 2017-07-02 DIAGNOSIS — Y999 Unspecified external cause status: Secondary | ICD-10-CM | POA: Diagnosis not present

## 2017-07-02 DIAGNOSIS — Z79899 Other long term (current) drug therapy: Secondary | ICD-10-CM | POA: Diagnosis not present

## 2017-07-02 DIAGNOSIS — Z794 Long term (current) use of insulin: Secondary | ICD-10-CM | POA: Insufficient documentation

## 2017-07-02 DIAGNOSIS — W19XXXA Unspecified fall, initial encounter: Secondary | ICD-10-CM | POA: Insufficient documentation

## 2017-07-02 DIAGNOSIS — R55 Syncope and collapse: Secondary | ICD-10-CM | POA: Diagnosis not present

## 2017-07-02 LAB — COMPREHENSIVE METABOLIC PANEL
ALT: 19 U/L (ref 14–54)
AST: 19 U/L (ref 15–41)
Albumin: 2.9 g/dL — ABNORMAL LOW (ref 3.5–5.0)
Alkaline Phosphatase: 74 U/L (ref 38–126)
Anion gap: 8 (ref 5–15)
BILIRUBIN TOTAL: 0.5 mg/dL (ref 0.3–1.2)
BUN: 6 mg/dL (ref 6–20)
CO2: 25 mmol/L (ref 22–32)
CREATININE: 0.67 mg/dL (ref 0.44–1.00)
Calcium: 8.4 mg/dL — ABNORMAL LOW (ref 8.9–10.3)
Chloride: 103 mmol/L (ref 101–111)
Glucose, Bld: 377 mg/dL — ABNORMAL HIGH (ref 65–99)
POTASSIUM: 3.9 mmol/L (ref 3.5–5.1)
Sodium: 136 mmol/L (ref 135–145)
TOTAL PROTEIN: 5.6 g/dL — AB (ref 6.5–8.1)

## 2017-07-02 LAB — CBC
HEMATOCRIT: 40 % (ref 36.0–46.0)
Hemoglobin: 13.1 g/dL (ref 12.0–15.0)
MCH: 28.2 pg (ref 26.0–34.0)
MCHC: 32.8 g/dL (ref 30.0–36.0)
MCV: 86 fL (ref 78.0–100.0)
Platelets: 256 10*3/uL (ref 150–400)
RBC: 4.65 MIL/uL (ref 3.87–5.11)
RDW: 13.6 % (ref 11.5–15.5)
WBC: 6.5 10*3/uL (ref 4.0–10.5)

## 2017-07-02 LAB — TROPONIN I: Troponin I: <0.03 ng/mL (ref <0.03)

## 2017-07-02 MED ORDER — SODIUM CHLORIDE 0.9 % IV BOLUS (SEPSIS): 1000.0000 mL | Freq: Once | INTRAVENOUS | Status: DC

## 2017-07-02 MED ORDER — CEPHALEXIN 500 MG PO CAPS: 500.0000 mg | ORAL_CAPSULE | Freq: Two times a day (BID) | ORAL | 0 refills | Status: DC
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
## 2017-07-02 NOTE — Discharge Instructions (Signed)
Please read attached information. If you experience any new or worsening signs or symptoms please return to the emergency room for evaluation. Please follow-up with your primary care provider or specialist as discussed. Please use medication prescribed only as directed and discontinue taking if you have any concerning signs or symptoms.   °

## 2017-07-02 NOTE — ED Provider Notes (Signed)
[facility] EMERGENCY DEPARTMENT Provider Note   CSN: [id number] Arrival date & time: 07/02/17  0901     History   Chief Complaint Chief Complaint  Patient presents with  . Panic Attack  . Lip Laceration    HPI Kara Hayes is a 36 y.o. female.  HPI   36 year old female presents today with complaints of lip laceration.  Patient reports a significant past medical history panic attacks.  Currently using Ambien and Xanax as needed.  Patient notes that her blood sugar has been running high recently as she has been somewhat noncompliant with her medications.  She notes frequent urinations, slightly dizzy when dehydrated.  She notes she had a panic attack typical of her previous with dizziness, rapid respirations, and a syncopal episode.  She notes no postictal phase, fell forward hit her face on a ice machine.  Patient notes today she is no longer dizzy, reports that she is having pain over her left lower lip at the site of the laceration.  She denies any loose teeth, normal alignment of dentition, no difficulty with range of motion of the jaw.  Patient denies any neck pain, headache, or any neurological deficits at this time.  She reports she did not take her insulin today.  Tetanus up-to-date per patient.    Past Medical History:  Diagnosis Date  . Bacterial vaginosis   . Diabetes mellitus without complication (HCC)     There are no active problems to display for this patient.   Past Surgical History:  Procedure Laterality Date  . CESAREAN SECTION      OB History    No data available       Home Medications    Prior to Admission medications   Medication Sig Start Date End Date Taking? Authorizing Provider  cephALEXin (KEFLEX) 500 MG capsule Take 1 capsule (500 mg total) 2 (two) times daily by mouth. 07/02/17   Jahad Old, Tinnie Gens, PA-C  clotrimazole (GYNE-LOTRIMIN) 1 % vaginal cream Place 1 Applicatorful vaginally at bedtime. 10/05/15   Linwood Dibbles, MD    HYDROcodone-acetaminophen (NORCO/VICODIN) 5-325 MG tablet Take 1-2 tablets by mouth every 4 (four) hours as needed. 11/07/15   Lorre Nick, MD  ibuprofen (ADVIL,MOTRIN) 200 MG tablet Take 400 mg by mouth every 6 (six) hours as needed for headache, mild pain or moderate pain.    [provider]  insulin aspart (NOVOLOG) 100 UNIT/ML injection Inject 16 Units into the skin 3 (three) times daily. Patient not taking: Reported on 08/05/2015 05/19/15   Marlon Pel, PA-C  Insulin Glargine (LANTUS SOLOSTAR) 100 UNIT/ML Solostar Pen Inject 27 Units into the skin at bedtime. Patient not taking: Reported on 08/05/2015 05/19/15   Marlon Pel, PA-C  metroNIDAZOLE (FLAGYL) 500 MG tablet Take 1 tablet (500 mg total) by mouth 2 (two) times daily. 10/05/15   Linwood Dibbles, MD  naproxen (NAPROSYN) 500 MG tablet Take 1 tablet (500 mg total) by mouth 2 (two) times daily. 12/29/15   Antony Madura, PA-C  ondansetron (ZOFRAN) 8 MG tablet Take 1 tablet (8 mg total) by mouth every 8 (eight) hours as needed for nausea or vomiting. Patient not taking: Reported on 11/07/2015 08/05/15   Mesner, Barbara Cower, MD  traMADol (ULTRAM) 50 MG tablet Take 1 tablet (50 mg total) by mouth every 6 (six) hours as needed for severe pain. 12/29/15   Antony Madura, PA-C    Family History No family history on file.  Social History Social History   Tobacco Use  .  Smoking status: Never Smoker  . Smokeless tobacco: Never Used  Substance Use Topics  . Alcohol use: Yes    Comment: socially  . Drug use: No     Allergies   Amoxicillin; Penicillins; and Reglan [metoclopramide]   Review of Systems Review of Systems  All other systems reviewed and are negative.   Physical Exam Updated Vital Signs BP (!) 126/98   Pulse 80   Temp 98.1 F (36.7 C) (Oral)   Resp 18   SpO2 100%   Physical Exam  Constitutional: She is oriented to person, place, and time. She appears well-developed and well-nourished.  HENT:  Head: Normocephalic.   2 puncture wounds along the left lower lip crossing into the mucosa, no wide mucosal openings-dentition normal no signs of fracture, remainder of jaw nontender full active range of motion, normal alignment  Eyes: Conjunctivae are normal. Pupils are equal, round, and reactive to light. Right eye exhibits no discharge. Left eye exhibits no discharge. No scleral icterus.  Neck: Normal range of motion. No JVD present. No tracheal deviation present.  Cardiovascular: Normal rate, regular rhythm and normal heart sounds.  Pulmonary/Chest: Effort normal. No stridor.  Musculoskeletal:  No CT or L-spine tenderness palpation  Neurological: She is alert and oriented to person, place, and time. No cranial nerve deficit. Coordination normal.  Skin: Skin is warm.  Psychiatric: She has a normal mood and affect. Her behavior is normal. Judgment and thought content normal.  Nursing note and vitals reviewed.    ED Treatments / Results  Labs (all labs ordered are listed, but only abnormal results are displayed) Labs Reviewed  COMPREHENSIVE METABOLIC PANEL - Abnormal; Notable for the following components:      Result Value   Glucose, Bld 377 (*)    Calcium 8.4 (*)    Total Protein 5.6 (*)    Albumin 2.9 (*)    All other components within normal limits  CBC  TROPONIN I    EKG  EKG Interpretation None       Radiology No results found.  Procedures Procedures (including critical care time)  Medications Ordered in ED Medications  sodium chloride 0.9 % bolus 1,000 mL ( Intravenous Canceled Entry 07/02/17 1242)     Initial Impression / Assessment and Plan / ED Course  I have reviewed the triage vital signs and the nursing notes.  Pertinent labs & imaging results that were available during my care of the patient were reviewed by me and considered in my medical decision making (see chart for details).      Final Clinical Impressions(s) / ED Diagnoses   Final diagnoses:  Lip laceration,  initial encounter    Labs: CBC, CMP, Trop  Imaging: ED EKG  Consults:  Therapeutics:  Discharge Meds:   Assessment/Plan:   36 year old female presents today with a laceration to her lip.  This was after syncopal episode.  Patient reports this is similar to previous episodes of panic attack, no comp gating features.  She is well-appearing now no acute distress.  She has reassuring physical exam laboratory analysis.  Patient is hyperglycemic here.  I recommended IV hydration with insulin, patient adamantly refusing reporting that she did not take her insulin and she will take it when she gets home.  Patient is consistently noncompliant with her insulin based on her previous blood sugar readings here.  She has no signs of DKA.  Patient requesting discharge, she will be discharged with prophylactic antibiotics and strict return precautions.  Patient verbalized  understanding and agreement to today's plan had no further questions or concerns.    ED Discharge Orders        Ordered    cephALEXin (KEFLEX) 500 MG capsule  2 times daily     07/02/17 1240       HedgesTinnie Gens, Sarahlynn Cisnero, PA-C 07/02/17 1248    Melene PlanFloyd, Dan, DO 07/02/17 1436

## 2017-07-02 NOTE — ED Notes (Signed)
Pt refusing treatment, states "I don't want none of that, I have to go get my daughter. I'm gonna go home and take my meds." PA aware.

## 2017-08-14 ENCOUNTER — Emergency Department (HOSPITAL_COMMUNITY)
Admission: EM | Admit: 2017-08-14 | Discharge: 2017-08-14 | Disposition: A | Discharge status: Home / Self Care | Payer: Medicaid Other | Attending: Emergency Medicine | Admitting: Emergency Medicine

## 2017-08-14 ENCOUNTER — Encounter (HOSPITAL_COMMUNITY): Payer: Self-pay

## 2017-08-14 ENCOUNTER — Other Ambulatory Visit: Payer: Self-pay

## 2017-08-14 DIAGNOSIS — R51 Headache: Secondary | ICD-10-CM | POA: Diagnosis present

## 2017-08-14 DIAGNOSIS — Z5321 Procedure and treatment not carried out due to patient leaving prior to being seen by health care provider: Secondary | ICD-10-CM | POA: Diagnosis not present

## 2017-08-14 LAB — CBC
HEMATOCRIT: 45.8 % (ref 36.0–46.0)
HEMOGLOBIN: 15.5 g/dL — AB (ref 12.0–15.0)
MCH: 28.9 pg (ref 26.0–34.0)
MCHC: 33.8 g/dL (ref 30.0–36.0)
MCV: 85.3 fL (ref 78.0–100.0)
Platelets: 316 10*3/uL (ref 150–400)
RBC: 5.37 MIL/uL — AB (ref 3.87–5.11)
RDW: 13.3 % (ref 11.5–15.5)
WBC: 8.3 10*3/uL (ref 4.0–10.5)

## 2017-08-14 LAB — BASIC METABOLIC PANEL
ANION GAP: 6 (ref 5–15)
BUN: 11 mg/dL (ref 6–20)
CHLORIDE: 103 mmol/L (ref 101–111)
CO2: 28 mmol/L (ref 22–32)
Calcium: 8.8 mg/dL — ABNORMAL LOW (ref 8.9–10.3)
Creatinine, Ser: 0.84 mg/dL (ref 0.44–1.00)
Glucose, Bld: 323 mg/dL — ABNORMAL HIGH (ref 65–99)
POTASSIUM: 3.9 mmol/L (ref 3.5–5.1)
SODIUM: 137 mmol/L (ref 135–145)

## 2017-08-14 LAB — CBG MONITORING, ED: Glucose-Capillary: 302 mg/dL — ABNORMAL HIGH (ref 65–99)

## 2017-08-14 LAB — URINALYSIS, ROUTINE W REFLEX MICROSCOPIC
Bilirubin Urine: NEGATIVE
Glucose, UA: >=500 mg/dL — AB
Hgb urine dipstick: NEGATIVE
KETONES UR: NEGATIVE mg/dL
Nitrite: POSITIVE — AB
PH: 6 (ref 5.0–8.0)
Protein, ur: NEGATIVE mg/dL
Specific Gravity, Urine: 1.042 — ABNORMAL HIGH (ref 1.005–1.030)

## 2017-08-14 LAB — I-STAT BETA HCG BLOOD, ED (MC, WL, AP ONLY)

## 2017-08-14 NOTE — ED Notes (Signed)
Called Pt to be roomed x3, no response. 
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
## 2017-08-14 NOTE — ED Notes (Signed)
Called Pt to be roomed, no response x2. 

## 2017-08-14 NOTE — ED Notes (Signed)
Pt did not come when called from lobby for vitals to be updated.

## 2017-09-27 ENCOUNTER — Other Ambulatory Visit: Payer: Self-pay

## 2017-09-27 ENCOUNTER — Emergency Department (HOSPITAL_COMMUNITY)
Admission: EM | Admit: 2017-09-27 | Discharge: 2017-09-27 | Disposition: A | Discharge status: Home / Self Care | Payer: Medicaid Other | Attending: Emergency Medicine | Admitting: Emergency Medicine

## 2017-09-27 ENCOUNTER — Encounter (HOSPITAL_COMMUNITY): Payer: Self-pay | Admitting: Emergency Medicine

## 2017-09-27 DIAGNOSIS — B373 Candidiasis of vulva and vagina: Secondary | ICD-10-CM | POA: Insufficient documentation

## 2017-09-27 DIAGNOSIS — E119 Type 2 diabetes mellitus without complications: Secondary | ICD-10-CM | POA: Insufficient documentation

## 2017-09-27 DIAGNOSIS — B3731 Acute candidiasis of vulva and vagina: Secondary | ICD-10-CM

## 2017-09-27 DIAGNOSIS — N898 Other specified noninflammatory disorders of vagina: Secondary | ICD-10-CM | POA: Diagnosis present

## 2017-09-27 DIAGNOSIS — Z79899 Other long term (current) drug therapy: Secondary | ICD-10-CM | POA: Insufficient documentation

## 2017-09-27 DIAGNOSIS — Z794 Long term (current) use of insulin: Secondary | ICD-10-CM | POA: Diagnosis not present

## 2017-09-27 LAB — URINALYSIS, ROUTINE W REFLEX MICROSCOPIC
Bilirubin Urine: NEGATIVE
HGB URINE DIPSTICK: NEGATIVE
KETONES UR: NEGATIVE mg/dL
NITRITE: NEGATIVE
PROTEIN: NEGATIVE mg/dL
Specific Gravity, Urine: 1.038 — ABNORMAL HIGH (ref 1.005–1.030)
pH: 5 (ref 5.0–8.0)

## 2017-09-27 LAB — I-STAT BETA HCG BLOOD, ED (MC, WL, AP ONLY): I-stat hCG, quantitative: <5 m[IU]/mL (ref <5)

## 2017-09-27 MED ORDER — FLUCONAZOLE 150 MG PO TABS: 150.0000 mg | ORAL_TABLET | Freq: Once | ORAL | 0 refills | Status: AC

## 2017-09-27 MED ORDER — FLUCONAZOLE 150 MG PO TABS
150.0000 mg | ORAL_TABLET | Freq: Once | ORAL | Status: AC
  Administered 2017-09-27: 150 mg via ORAL
  Filled 2017-09-27: qty 1

## 2017-09-27 NOTE — Discharge Instructions (Signed)
You are being treated for yeast infection. You were given a dose of diflucan here, if your symptoms persist in the next 3 days, then take the second dose of diflucan. Stay well hydrated. Avoid using harsh soaps or detergents, don't douche. Always use protection when engaging in sexual activity. Alternate between tylenol and motrin as needed for pain. Perform warm sitz baths to help with pain and itching. Follow up with your primary care doctor in 5-7 days for recheck of symptoms. Return to the ER for emergent changes or worsening symptoms.

## 2017-09-27 NOTE — ED Triage Notes (Signed)
Pt reports vaginal itching x3 weeks. States vagisil not effective. Talking on phone during triage. Denies urinary complaints. States no new sexual partners.

## 2017-09-27 NOTE — ED Notes (Signed)
Pt. Refused vital recheck.

## 2017-09-27 NOTE — ED Provider Notes (Signed)
[facility]   CSN: [id number] Arrival date & time: 09/27/17  2016     History   Chief Complaint Chief Complaint  Patient presents with  . Vaginal Itching    HPI Kara Hayes is a 37 y.o. female with a PMHx of DM2, who presents to the ED with complaints of vaginal itching x 3 weeks.  Patient states that she ran out of her usual body soap and had to change to a different body soap, and then her symptoms started.  She has had a yeast infection in the past and this feels similar.  She has tried Vagisil with no relief of symptoms, and wiping or touching the area as well as urine coming into contact with her vagina aggravates her symptoms.  She is sexually active with one female partner, unprotected.  She denies vaginal bleeding or discharge, genital sores, fevers, chills, CP, SOB, abd pain, N/V/D/C, hematuria, dysuria, myalgias, arthralgias, numbness, tingling, focal weakness, or any other complaints at this time.    The history is provided by the patient and medical records. No language interpreter was used.  Vaginal Itching  This is a new problem. The current episode started more than 1 week ago. The problem occurs constantly. The problem has not changed since onset.Pertinent negatives include no chest pain, no abdominal pain and no shortness of breath. Exacerbated by: wiping or touching the area, urine touching area. Nothing relieves the symptoms. Treatments tried: vagisil. The treatment provided no relief.    Past Medical History:  Diagnosis Date  . Bacterial vaginosis   . Diabetes mellitus without complication (HCC)     There are no active problems to display for this patient.   Past Surgical History:  Procedure Laterality Date  . CESAREAN SECTION      OB History    No data available       Home Medications    Prior to Admission medications   Medication Sig Start Date End Date Taking? Authorizing Provider    acetaminophen (TYLENOL) 500 MG tablet Take 1,000 mg by mouth every 6 (six) hours as needed for headache (pain).   Yes [provider]  flintstones complete (FLINTSTONES) 60 MG chewable tablet Chew 1 tablet by mouth daily.   Yes [provider]  ibuprofen (ADVIL,MOTRIN) 200 MG tablet Take 400 mg by mouth every 6 (six) hours as needed for headache, mild pain or moderate pain.   Yes [provider]  cephALEXin (KEFLEX) 500 MG capsule Take 1 capsule (500 mg total) 2 (two) times daily by mouth. Patient not taking: Reported on 09/27/2017 07/02/17   Hedges, Tinnie GensJeffrey, PA-C  clotrimazole (GYNE-LOTRIMIN) 1 % vaginal cream Place 1 Applicatorful vaginally at bedtime. Patient not taking: Reported on 09/27/2017 10/05/15   Linwood DibblesKnapp, Jon, MD  HYDROcodone-acetaminophen (NORCO/VICODIN) 5-325 MG tablet Take 1-2 tablets by mouth every 4 (four) hours as needed. Patient not taking: Reported on 09/27/2017 11/07/15   Lorre NickAllen, Anthony, MD  insulin aspart (NOVOLOG) 100 UNIT/ML injection Inject 16 Units into the skin 3 (three) times daily. Patient not taking: Reported on 08/05/2015 05/19/15   Marlon PelGreene, Tiffany, PA-C  Insulin Glargine (LANTUS SOLOSTAR) 100 UNIT/ML Solostar Pen Inject 27 Units into the skin at bedtime. Patient not taking: Reported on 08/05/2015 05/19/15   Marlon PelGreene, Tiffany, PA-C  metroNIDAZOLE (FLAGYL) 500 MG tablet Take 1 tablet (500 mg total) by mouth 2 (two) times daily. Patient not taking: Reported on 09/27/2017 10/05/15   Linwood DibblesKnapp, Jon, MD  naproxen (NAPROSYN) 500  MG tablet Take 1 tablet (500 mg total) by mouth 2 (two) times daily. Patient not taking: Reported on 09/27/2017 12/29/15   Antony Madura, PA-C  ondansetron (ZOFRAN) 8 MG tablet Take 1 tablet (8 mg total) by mouth every 8 (eight) hours as needed for nausea or vomiting. Patient not taking: Reported on 11/07/2015 08/05/15   Mesner, Barbara Cower, MD  traMADol (ULTRAM) 50 MG tablet Take 1 tablet (50 mg total) by mouth every 6 (six) hours as needed for severe  pain. Patient not taking: Reported on 09/27/2017 12/29/15   Antony Madura, PA-C    Family History Family History  Problem Relation Age of Onset  . Diabetes Mother   . Diabetes Father     Social History Social History   Tobacco Use  . Smoking status: Never Smoker  . Smokeless tobacco: Never Used  Substance Use Topics  . Alcohol use: Yes    Comment: socially  . Drug use: No     Allergies   Amoxicillin; Penicillins; and Reglan [metoclopramide]   Review of Systems Review of Systems  Constitutional: Negative for chills and fever.  Respiratory: Negative for shortness of breath.   Cardiovascular: Negative for chest pain.  Gastrointestinal: Negative for abdominal pain, constipation, diarrhea, nausea and vomiting.  Genitourinary: Positive for vaginal pain (itching). Negative for dysuria, genital sores, hematuria, vaginal bleeding and vaginal discharge.  Musculoskeletal: Negative for arthralgias and myalgias.  Skin: Negative for color change.  Allergic/Immunologic: Positive for immunocompromised state (DM2).  Neurological: Negative for weakness and numbness.  Psychiatric/Behavioral: Negative for confusion.   All other systems reviewed and are negative for acute change except as noted in the HPI.    Physical Exam Updated Vital Signs BP (!) 131/94 (BP Location: Right Arm)   Pulse 94   Temp 98.6 F (37 C) (Oral)   Resp 18   Ht 5\' 6"  (1.676 m)   Wt 106.6 kg (235 lb)   LMP 01/25/2017 (Approximate)   SpO2 99%   BMI 37.93 kg/m   Physical Exam  Constitutional: She is oriented to person, place, and time. Vital signs are normal. She appears well-developed and well-nourished.  Non-toxic appearance. No distress.  Afebrile, nontoxic, NAD  HENT:  Head: Normocephalic and atraumatic.  Mouth/Throat: Oropharynx is clear and moist and mucous membranes are normal.  Eyes: Conjunctivae and EOM are normal. Right eye exhibits no discharge. Left eye exhibits no discharge.  Neck: Normal range  of motion. Neck supple.  Cardiovascular: Normal rate, regular rhythm, normal heart sounds and intact distal pulses. Exam reveals no gallop and no friction rub.  No murmur heard. Pulmonary/Chest: Effort normal and breath sounds normal. No respiratory distress. She has no decreased breath sounds. She has no wheezes. She has no rhonchi. She has no rales.  Abdominal: Soft. Normal appearance and bowel sounds are normal. She exhibits no distension. There is no tenderness. There is no rigidity, no rebound, no guarding, no CVA tenderness, no tenderness at McBurney's point and negative Murphy's sign.  Soft, NTND, +BS throughout, no r/g/r, neg murphy's, neg mcburney's, no CVA TTP   Genitourinary:  Genitourinary Comments: Pt declined  Musculoskeletal: Normal range of motion.  Neurological: She is alert and oriented to person, place, and time. She has normal strength. No sensory deficit.  Skin: Skin is warm, dry and intact. No rash noted.  Psychiatric: She has a normal mood and affect.  Nursing note and vitals reviewed.    ED Treatments / Results  Labs (all labs ordered are listed, but only abnormal  results are displayed) Labs Reviewed  URINALYSIS, ROUTINE W REFLEX MICROSCOPIC - Abnormal; Notable for the following components:      Result Value   APPearance HAZY (*)    Specific Gravity, Urine 1.038 (*)    Glucose, UA >=500 (*)    Leukocytes, UA SMALL (*)    Bacteria, UA FEW (*)    Squamous Epithelial / LPF 6-30 (*)    All other components within normal limits  I-STAT BETA HCG BLOOD, ED ([facility], [facility], [facility] ONLY)    EKG  EKG Interpretation None       Radiology No results found.  Procedures Procedures (including critical care time)  Medications Ordered in ED Medications  fluconazole (DIFLUCAN) tablet 150 mg (not administered)     Initial Impression / Assessment and Plan / ED Course  I have reviewed the triage vital signs and the nursing notes.  Pertinent labs & imaging results that were  available during my care of the patient were reviewed by me and considered in my medical decision making (see chart for details).     37 y.o. female here with vaginal itching x3wks. No discharge or other symptoms. On exam, no abdominal tenderness. BetaHCG neg. U/A contaminated with 6-30 squamous cells, but nitrite neg, small leuks, 6-30 WBCs and few bacteria; doubt UTI given lack of urinary symptoms and given grossly contaminated urine sample. Overall symptoms most consistent with yeast vaginitis, pt opted for no pelvic exam at this time and just empiric tx of yeast vaginitis, which I think is a reasonable option. Advised avoidance of harsh soaps and douching. Will give diflucan here and dose to take in 3 days if symptoms persist. F/up with PCP in 1wk for recheck. I explained the diagnosis and have given explicit precautions to return to the ER including for any other new or worsening symptoms. The patient understands and accepts the medical plan as it's been dictated and I have answered their questions. Discharge instructions concerning home care and prescriptions have been given. The patient is STABLE and is discharged to home in good condition.    Final Clinical Impressions(s) / ED Diagnoses   Final diagnoses:  Yeast vaginitis  Vaginal itching    ED Discharge Orders        Ordered    fluconazole (DIFLUCAN) 150 MG tablet   Once     09/27/17 [address], [place], [place] 09/27/17 2224    Blane Ohara, MD 09/27/17 2257

## 2017-10-22 ENCOUNTER — Emergency Department (HOSPITAL_COMMUNITY)
Admission: EM | Admit: 2017-10-22 | Discharge: 2017-10-22 | Payer: Medicaid Other | Attending: Emergency Medicine | Admitting: Emergency Medicine

## 2017-10-22 ENCOUNTER — Emergency Department (HOSPITAL_COMMUNITY): Payer: Medicaid Other

## 2017-10-22 ENCOUNTER — Encounter (HOSPITAL_COMMUNITY): Payer: Self-pay | Admitting: Emergency Medicine

## 2017-10-22 DIAGNOSIS — Y929 Unspecified place or not applicable: Secondary | ICD-10-CM | POA: Diagnosis not present

## 2017-10-22 DIAGNOSIS — S60512A Abrasion of left hand, initial encounter: Secondary | ICD-10-CM | POA: Diagnosis not present

## 2017-10-22 DIAGNOSIS — E119 Type 2 diabetes mellitus without complications: Secondary | ICD-10-CM | POA: Insufficient documentation

## 2017-10-22 DIAGNOSIS — Y999 Unspecified external cause status: Secondary | ICD-10-CM | POA: Insufficient documentation

## 2017-10-22 DIAGNOSIS — T07XXXA Unspecified multiple injuries, initial encounter: Secondary | ICD-10-CM

## 2017-10-22 DIAGNOSIS — Y939 Activity, unspecified: Secondary | ICD-10-CM | POA: Diagnosis not present

## 2017-10-22 DIAGNOSIS — M542 Cervicalgia: Secondary | ICD-10-CM | POA: Insufficient documentation

## 2017-10-22 DIAGNOSIS — S20219A Contusion of unspecified front wall of thorax, initial encounter: Secondary | ICD-10-CM | POA: Insufficient documentation

## 2017-10-22 DIAGNOSIS — S0990XA Unspecified injury of head, initial encounter: Secondary | ICD-10-CM | POA: Diagnosis present

## 2017-10-22 DIAGNOSIS — S0083XA Contusion of other part of head, initial encounter: Secondary | ICD-10-CM

## 2017-10-22 DIAGNOSIS — R51 Headache: Secondary | ICD-10-CM | POA: Diagnosis not present
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
## 2017-10-22 NOTE — ED Notes (Signed)
Bed: [facility] Expected date:  Expected time:  Means of arrival:  Comments: 

## 2017-11-03 ENCOUNTER — Encounter (HOSPITAL_COMMUNITY): Payer: Self-pay | Admitting: Emergency Medicine

## 2017-11-03 ENCOUNTER — Emergency Department (HOSPITAL_COMMUNITY)
Admission: EM | Admit: 2017-11-03 | Discharge: 2017-11-03 | Disposition: A | Discharge status: Home / Self Care | Payer: Medicaid Other | Attending: Emergency Medicine | Admitting: Emergency Medicine

## 2017-11-03 ENCOUNTER — Other Ambulatory Visit: Payer: Self-pay

## 2017-11-03 DIAGNOSIS — R112 Nausea with vomiting, unspecified: Secondary | ICD-10-CM | POA: Diagnosis present

## 2017-11-03 DIAGNOSIS — J069 Acute upper respiratory infection, unspecified: Secondary | ICD-10-CM | POA: Diagnosis not present

## 2017-11-03 DIAGNOSIS — B373 Candidiasis of vulva and vagina: Secondary | ICD-10-CM

## 2017-11-03 DIAGNOSIS — B3731 Acute candidiasis of vulva and vagina: Secondary | ICD-10-CM

## 2017-11-03 MED ORDER — ALBUTEROL SULFATE HFA 108 (90 BASE) MCG/ACT IN AERS
2.0000 | INHALATION_SPRAY | Freq: Once | RESPIRATORY_TRACT | Status: AC
Start: 2017-11-03 — End: 2017-11-03
  Administered 2017-11-03: 2 via RESPIRATORY_TRACT
  Filled 2017-11-03: qty 6.7

## 2017-11-03 MED ORDER — NYSTATIN-TRIAMCINOLONE 100000-0.1 UNIT/GM-% EX CREA: TOPICAL_CREAM | CUTANEOUS | 1 refills | Status: DC

## 2017-11-03 MED ORDER — METRONIDAZOLE 500 MG PO TABS
2000.0000 mg | ORAL_TABLET | Freq: Once | ORAL | Status: AC
Start: 2017-11-03 — End: 2017-11-03
  Administered 2017-11-03: 2000 mg via ORAL
  Filled 2017-11-03: qty 4

## 2017-11-03 MED ORDER — FLUCONAZOLE 150 MG PO TABS
150.0000 mg | ORAL_TABLET | Freq: Once | ORAL | Status: AC
  Administered 2017-11-03: 150 mg via ORAL
  Filled 2017-11-03: qty 1

## 2017-11-03 NOTE — ED Notes (Signed)
Patient verbalizes understanding of discharge instructions. Opportunity for questioning and answers were provided. Armband removed by staff, pt discharged from ED ambulatory.   

## 2017-11-03 NOTE — ED Triage Notes (Addendum)
Reports having a cough for over a month.  Also c/o nausea every morning for the last couple of days.  Unsure if pregnant.  Added that she is having vaginal burning and itching that has been going on for two weeks.

## 2017-11-03 NOTE — ED Notes (Signed)
ED Provider at bedside. 

## 2017-11-04 NOTE — ED Provider Notes (Signed)
[facility]   CSN: [id number] Arrival date & time: 11/03/17  [id number]     History   Chief Complaint Chief Complaint  Patient presents with  . URI  . Emesis    HPI Kara Hayes is a 37 y.o. female.   URI   Associated symptoms include nausea, vomiting and congestion. Pertinent negatives include no chest pain.  Emesis   Associated symptoms include URI.  Vaginal Itching  This is a recurrent problem. The current episode started more than 2 days ago. The problem occurs daily. The problem has not changed since onset.Pertinent negatives include no chest pain. Nothing aggravates the symptoms. Nothing relieves the symptoms. Treatments tried: otc meds.    Past Medical History:  Diagnosis Date  . Bacterial vaginosis   . Diabetes mellitus without complication (HCC)     There are no active problems to display for this patient.   Past Surgical History:  Procedure Laterality Date  . CESAREAN SECTION      OB History    No data available       Home Medications    Prior to Admission medications   Medication Sig Start Date End Date Taking? Authorizing Provider  acetaminophen (TYLENOL) 500 MG tablet Take 1,000 mg by mouth every 6 (six) hours as needed for headache (pain).    [provider]  ibuprofen (ADVIL,MOTRIN) 200 MG tablet Take 400 mg by mouth every 6 (six) hours as needed for headache, mild pain or moderate pain.    [provider]  nystatin-triamcinolone [place] [employer](MYCOLOG II) cream Apply to affected area daily 11/03/17   Sade Hollon, Barbara CowerJason, MD    Family History Family History  Problem Relation Age of Onset  . Diabetes Mother   . Diabetes Father     Social History Social History   Tobacco Use  . Smoking status: Never Smoker  . Smokeless tobacco: Never Used  Substance Use Topics  . Alcohol use: Yes    Comment: socially  . Drug use: No     Allergies   Amoxicillin; Penicillins; and Reglan  [metoclopramide]   Review of Systems Review of Systems  HENT: Positive for congestion.   Cardiovascular: Negative for chest pain.  Gastrointestinal: Positive for nausea and vomiting.  All other systems reviewed and are negative.    Physical Exam Updated Vital Signs BP (!) 127/96   Pulse (!) 108   Temp 98.3 F (36.8 C) (Oral)   Resp 16   Ht 5\' 6"  (1.676 m)   Wt 105.2 kg (232 lb)   LMP  (LMP Unknown)   SpO2 100%   BMI 37.45 kg/m   Physical Exam  Constitutional: She is oriented to person, place, and time. She appears well-developed and well-nourished.  HENT:  Head: Normocephalic and atraumatic.  Eyes: Conjunctivae and EOM are normal.  Neck: Normal range of motion.  Cardiovascular: Normal rate and regular rhythm.  Pulmonary/Chest: No stridor. No respiratory distress.  Abdominal: Soft. She exhibits no distension.  Musculoskeletal: Normal range of motion. She exhibits no edema or deformity.  Neurological: She is alert and oriented to person, place, and time.  Skin: Skin is warm and dry.  Nursing note and vitals reviewed.    ED Treatments / Results  Labs (all labs ordered are listed, but only abnormal results are displayed) Labs Reviewed - No data to display  EKG  EKG Interpretation None       Radiology No results found.  Procedures Procedures (including critical care time)  Medications Ordered in ED Medications  fluconazole (DIFLUCAN) tablet 150 mg (150 mg Oral Given 11/03/17 2246)  metroNIDAZOLE (FLAGYL) tablet 2,000 mg (2,000 mg Oral Given 11/03/17 2242)  albuterol (PROVENTIL HFA;VENTOLIN HFA) 108 (90 Base) MCG/ACT inhaler 2 puff (2 puffs Inhalation Given 11/03/17 2242)     Initial Impression / Assessment and Plan / ED Course  I have reviewed the triage vital signs and the nursing notes.  Pertinent labs & imaging results that were available during my care of the patient were reviewed by me and considered in my medical decision making (see chart for  details).   Likely candida vaginitis and URI. Will treat for both.   Final Clinical Impressions(s) / ED Diagnoses   Final diagnoses:  Upper respiratory tract infection, unspecified type  Candidal vaginitis    ED Discharge Orders        Ordered    nystatin-triamcinolone (MYCOLOG II) cream     11/03/17 2234       Lenford Beddow, Barbara Cower, MD 11/04/17 1610

## 2018-01-08 ENCOUNTER — Encounter (HOSPITAL_COMMUNITY): Payer: Self-pay

## 2018-01-08 ENCOUNTER — Emergency Department (HOSPITAL_COMMUNITY)
Admission: EM | Admit: 2018-01-08 | Discharge: 2018-01-08 | Disposition: A | Discharge status: Home / Self Care | Payer: Medicaid Other | Attending: Emergency Medicine | Admitting: Emergency Medicine

## 2018-01-08 DIAGNOSIS — L02412 Cutaneous abscess of left axilla: Secondary | ICD-10-CM | POA: Diagnosis present

## 2018-01-08 DIAGNOSIS — Z79899 Other long term (current) drug therapy: Secondary | ICD-10-CM | POA: Insufficient documentation

## 2018-01-08 DIAGNOSIS — E119 Type 2 diabetes mellitus without complications: Secondary | ICD-10-CM | POA: Insufficient documentation

## 2018-01-08 MED ORDER — SULFAMETHOXAZOLE-TRIMETHOPRIM 800-160 MG PO TABS: 1.0000 | ORAL_TABLET | Freq: Two times a day (BID) | ORAL | 0 refills | Status: AC

## 2018-01-08 MED ORDER — HYDROCODONE-ACETAMINOPHEN 5-325 MG PO TABS: 1.0000 | ORAL_TABLET | Freq: Four times a day (QID) | ORAL | 0 refills | Status: DC | PRN

## 2018-01-08 NOTE — ED Notes (Signed)
See provider assessment 

## 2018-01-08 NOTE — ED Provider Notes (Signed)
[facility] EMERGENCY DEPARTMENT Provider Note   CSN: [id number] Arrival date & time: 01/08/18  1931     History   Chief Complaint Chief Complaint  Patient presents with  . Abscess    HPI Kara Hayes is a 37 y.o. female.  HPI   Kara Hayes is a 37 y.o. female, with a history of DM, presenting to the ED with an area of redness and swelling to the left axilla for the last 3 days.  She has not tried any therapies for it.  She has not had anything like this before.  Pain is severe, sharp, nonradiating.  Denies fever/chills, nausea/vomiting, numbness, weakness, difficulty breathing, drainage from the wound, or any other complaints.   Past Medical History:  Diagnosis Date  . Bacterial vaginosis   . Diabetes mellitus without complication (HCC)     There are no active problems to display for this patient.   Past Surgical History:  Procedure Laterality Date  . CESAREAN SECTION       OB History   None      Home Medications    Prior to Admission medications   Medication Sig Start Date End Date Taking? Authorizing Provider  acetaminophen (TYLENOL) 500 MG tablet Take 1,000 mg by mouth every 6 (six) hours as needed for headache (pain).    [provider]  HYDROcodone-acetaminophen (NORCO/VICODIN) 5-325 MG tablet Take 1-2 tablets by mouth every 6 (six) hours as needed for severe pain. 01/08/18   Joy, Shawn C, PA-C  ibuprofen (ADVIL,MOTRIN) 200 MG tablet Take 400 mg by mouth every 6 (six) hours as needed for headache, mild pain or moderate pain.    [provider]  nystatin-triamcinolone (MYCOLOG II) cream Apply to affected area daily 11/03/17   Mesner, Barbara Cower, MD  sulfamethoxazole-trimethoprim (BACTRIM DS,SEPTRA DS) 800-160 MG tablet Take 1 tablet by mouth 2 (two) times daily for 7 days. 01/08/18 01/15/18  Anselm Pancoast, PA-C    Family History Family History  Problem Relation Age of Onset  . Diabetes Mother   . Diabetes Father      Social History Social History   Tobacco Use  . Smoking status: Never Smoker  . Smokeless tobacco: Never Used  Substance Use Topics  . Alcohol use: Yes    Comment: socially  . Drug use: No     Allergies   Amoxicillin; Penicillins; and Reglan [metoclopramide]   Review of Systems Review of Systems  Constitutional: Negative for fever.  Respiratory: Negative for shortness of breath.   Cardiovascular: Negative for chest pain.  Gastrointestinal: Negative for nausea and vomiting.  Skin: Positive for color change.       Painful swelling under the left arm  Neurological: Negative for weakness and numbness.     Physical Exam Updated Vital Signs BP (!) 129/91   Pulse (!) 101   Temp 97.7 F (36.5 C) (Oral)   Resp 18   SpO2 100%   Physical Exam  Constitutional: She appears well-developed and well-nourished. No distress.  HENT:  Head: Normocephalic and atraumatic.  Eyes: Conjunctivae are normal.  Neck: Neck supple.  Cardiovascular: Normal rate and regular rhythm.  Pulmonary/Chest: Effort normal.  Musculoskeletal: She exhibits edema and tenderness.  Neurological: She is alert.  Sensation to light touch intact. Strength 5/5 in the left shoulder, elbow, and wrist.  Grip strength equal.  Skin: Skin is warm and dry. She is not diaphoretic. There is erythema. No pallor.  Area of tenderness, erythema, and induration to the left  axilla measuring approximately 3 x 2 cm.  No noted drainage.  Psychiatric: She has a normal mood and affect. Her behavior is normal.  Nursing note and vitals reviewed.    ED Treatments / Results  Labs (all labs ordered are listed, but only abnormal results are displayed) Labs Reviewed - No data to display  EKG None  Radiology No results found.  Procedures [place] bedside Date/Time: 01/08/2018 10:46 PM Performed by: Anselm Pancoast, PA-C Authorized by: Anselm Pancoast, PA-C  Consent: Verbal consent obtained. Risks and benefits: risks, benefits and  alternatives were discussed Consent given by: patient Patient identity confirmed: verbally with patient and provided demographic data Local anesthesia used: no  Anesthesia: Local anesthesia used: no  Sedation: Patient sedated: no  Patient tolerance: Patient tolerated the procedure well with no immediate complications    (including critical care time)  EMERGENCY DEPARTMENT US SOFT TISSUE INTERPRETATION "Study: Limited Soft Tissue Ultrasound"  INDICATIONS: Pain and Soft tissue infection Multiple views of the body part were obtained in real-time with a multi-frequency linear probe  PERFORMED BY: Myself IMAGES ARCHIVED?: Yes SIDE:Left BODY PART:Axilla INTERPRETATION:  Abcess present and Cellulitis present    Medications Ordered in ED Medications - No data to display   Initial Impression / Assessment and Plan / ED Course  I have reviewed the triage vital signs and the nursing notes.  Pertinent labs & imaging results that were available during my care of the patient were reviewed by me and considered in my medical decision making (see chart for details).      Patient presents with an area of erythema and swelling to the left axilla.  Abscess and cellulitis noted on ultrasound.  I&D was advised with explanation.  Patient refused.  Patient states she understands that the recommended and indicated treatment is I&D and that the problem may worsen, recur, and/or cause a systemic infection.  Will place patient on antibiotics.  She has been instructed to follow-up with her PCP for recheck. The patient was given instructions for home care as well as return precautions. Patient voices understanding of these instructions, accepts the plan, and is comfortable with discharge.    Final Clinical Impressions(s) / ED Diagnoses   Final diagnoses:  Abscess of left axilla    ED Discharge Orders        Ordered    sulfamethoxazole-trimethoprim (BACTRIM DS,SEPTRA DS) 800-160 MG tablet  2  times daily     01/08/18 2254    HYDROcodone-acetaminophen (NORCO/VICODIN) 5-325 MG tablet  Every 6 hours PRN     01/08/18 2255       Anselm Pancoast, PA-C 01/08/18 2317    Linwood Dibbles, MD 01/12/18 [phone]

## 2018-01-08 NOTE — Discharge Instructions (Addendum)
You have evidence of a skin abscess.  Recommended treatment is a procedure called incision and drainage, which involves cutting into the abscess and draining it.  You have declined to this procedure.  We will do a trial of antibiotics. Please take all of your antibiotics until finished!   You may develop abdominal discomfort or diarrhea from the antibiotic.  You may help offset this with probiotics which you can buy or get in yogurt. Do not eat or take the probiotics until 2 hours after your antibiotic.   Antiinflammatory medications: Take 600 mg of ibuprofen every 6 hours or 440 mg (over the counter dose) to 500 mg (prescription dose) of naproxen every 12 hours for the next 3 days. After this time, these medications may be used as needed for pain. Take these medications with food to avoid upset stomach. Choose only one of these medications, do not take them together. Tylenol: Should you continue to have additional pain while taking the ibuprofen or naproxen, you may add in tylenol as needed. Your daily total maximum amount of tylenol from all sources should be limited to /day for persons without liver problems, or /day for those with liver problems. Vicodin: May take Vicodin as needed for severe pain.  Do not drive or perform other dangerous activities while taking the Vicodin.  Please note that each pill of Vicodin contains 325 mg of Tylenol and the above dosage limits apply.  Follow-up with your primary care provider on this matter.  Return to the ED should symptoms worsen or you change your mind regarding incision and drainage.

## 2018-01-08 NOTE — ED Notes (Signed)
Arrives by EMS for c.o. Abscess to left underarm with a head. Triage rooms full, pt sititng in triage waiting area to have triage completed by different RN.

## 2018-01-08 NOTE — ED Triage Notes (Signed)
Pt states that for the past three days she has had a large abscess on her L axilla, no drainage.

## 2018-01-30 ENCOUNTER — Encounter (HOSPITAL_COMMUNITY): Payer: Self-pay | Admitting: *Deleted

## 2018-01-30 ENCOUNTER — Inpatient Hospital Stay (HOSPITAL_COMMUNITY)
Admission: EM | Admit: 2018-01-30 | Discharge: 2018-02-02 | DRG: 872 | Disposition: A | Discharge status: Home / Self Care | Payer: Medicaid Other | Attending: Internal Medicine | Admitting: Internal Medicine

## 2018-01-30 ENCOUNTER — Other Ambulatory Visit: Payer: Self-pay

## 2018-01-30 DIAGNOSIS — Z888 Allergy status to other drugs, medicaments and biological substances status: Secondary | ICD-10-CM

## 2018-01-30 DIAGNOSIS — Z880 Allergy status to penicillin: Secondary | ICD-10-CM

## 2018-01-30 DIAGNOSIS — L0390 Cellulitis, unspecified: Secondary | ICD-10-CM

## 2018-01-30 DIAGNOSIS — L0291 Cutaneous abscess, unspecified: Secondary | ICD-10-CM

## 2018-01-30 DIAGNOSIS — R112 Nausea with vomiting, unspecified: Secondary | ICD-10-CM

## 2018-01-30 DIAGNOSIS — E1165 Type 2 diabetes mellitus with hyperglycemia: Secondary | ICD-10-CM | POA: Diagnosis present

## 2018-01-30 DIAGNOSIS — L732 Hidradenitis suppurativa: Secondary | ICD-10-CM | POA: Diagnosis present

## 2018-01-30 DIAGNOSIS — Z79899 Other long term (current) drug therapy: Secondary | ICD-10-CM

## 2018-01-30 DIAGNOSIS — E119 Type 2 diabetes mellitus without complications: Secondary | ICD-10-CM

## 2018-01-30 DIAGNOSIS — Z794 Long term (current) use of insulin: Secondary | ICD-10-CM

## 2018-01-30 DIAGNOSIS — R739 Hyperglycemia, unspecified: Secondary | ICD-10-CM

## 2018-01-30 DIAGNOSIS — L03112 Cellulitis of left axilla: Secondary | ICD-10-CM | POA: Diagnosis present

## 2018-01-30 DIAGNOSIS — A419 Sepsis, unspecified organism: Secondary | ICD-10-CM

## 2018-01-30 LAB — COMPREHENSIVE METABOLIC PANEL
ALK PHOS: 71 U/L (ref 38–126)
ALT: 12 U/L — ABNORMAL LOW (ref 14–54)
ANION GAP: 8 (ref 5–15)
AST: 14 U/L — ABNORMAL LOW (ref 15–41)
Albumin: 2.7 g/dL — ABNORMAL LOW (ref 3.5–5.0)
BUN: 9 mg/dL (ref 6–20)
CALCIUM: 8.3 mg/dL — AB (ref 8.9–10.3)
CO2: 25 mmol/L (ref 22–32)
Chloride: 101 mmol/L (ref 101–111)
Creatinine, Ser: 0.9 mg/dL (ref 0.44–1.00)
GFR calc non Af Amer: >60 mL/min (ref >60)
Glucose, Bld: 350 mg/dL — ABNORMAL HIGH (ref 65–99)
Potassium: 3.7 mmol/L (ref 3.5–5.1)
SODIUM: 134 mmol/L — AB (ref 135–145)
TOTAL PROTEIN: 6.7 g/dL (ref 6.5–8.1)
Total Bilirubin: 0.6 mg/dL (ref 0.3–1.2)

## 2018-01-30 LAB — URINALYSIS, ROUTINE W REFLEX MICROSCOPIC
BILIRUBIN URINE: NEGATIVE
Bacteria, UA: NONE SEEN
Glucose, UA: >=500 mg/dL — AB
Ketones, ur: NEGATIVE mg/dL
Leukocytes, UA: NEGATIVE
NITRITE: NEGATIVE
PROTEIN: NEGATIVE mg/dL
Specific Gravity, Urine: 1.028 (ref 1.005–1.030)
pH: 6 (ref 5.0–8.0)

## 2018-01-30 LAB — I-STAT BETA HCG BLOOD, ED (MC, WL, AP ONLY): HCG, QUANTITATIVE: 16.5 m[IU]/mL — AB (ref <5)

## 2018-01-30 LAB — CBC
HCT: 35.9 % — ABNORMAL LOW (ref 36.0–46.0)
HEMOGLOBIN: 11.7 g/dL — AB (ref 12.0–15.0)
MCH: 27.5 pg (ref 26.0–34.0)
MCHC: 32.6 g/dL (ref 30.0–36.0)
MCV: 84.3 fL (ref 78.0–100.0)
Platelets: 317 10*3/uL (ref 150–400)
RBC: 4.26 MIL/uL (ref 3.87–5.11)
RDW: 12.7 % (ref 11.5–15.5)
WBC: 16.8 10*3/uL — ABNORMAL HIGH (ref 4.0–10.5)

## 2018-01-30 LAB — LIPASE, BLOOD: Lipase: 20 U/L (ref 11–51)

## 2018-01-30 NOTE — ED Notes (Signed)
Pt moved to triage A4 for re eval of temp

## 2018-01-30 NOTE — ED Notes (Signed)
Temp 103.0, Triage RN aware

## 2018-01-30 NOTE — ED Notes (Signed)
Results reviewed.  No changes in acuity at this time 

## 2018-01-30 NOTE — ED Triage Notes (Signed)
Pt states that she has had nausea and vomiting for 1 week. Noticed large redness/swelling/pain just under the left axilla 3-4 days (says she thinks she may have been bit by something). Reports intermittent fevers (tmax 100.8 today). Last tylenol around 1600, last motrin 1500.

## 2018-01-31 ENCOUNTER — Encounter (HOSPITAL_COMMUNITY): Payer: Self-pay | Admitting: Family Medicine

## 2018-01-31 ENCOUNTER — Emergency Department (HOSPITAL_COMMUNITY): Payer: Medicaid Other

## 2018-01-31 DIAGNOSIS — Z888 Allergy status to other drugs, medicaments and biological substances status: Secondary | ICD-10-CM | POA: Diagnosis not present

## 2018-01-31 DIAGNOSIS — R739 Hyperglycemia, unspecified: Secondary | ICD-10-CM | POA: Diagnosis not present

## 2018-01-31 DIAGNOSIS — L03112 Cellulitis of left axilla: Secondary | ICD-10-CM | POA: Diagnosis present

## 2018-01-31 DIAGNOSIS — Z794 Long term (current) use of insulin: Secondary | ICD-10-CM | POA: Diagnosis not present

## 2018-01-31 DIAGNOSIS — A419 Sepsis, unspecified organism: Secondary | ICD-10-CM | POA: Diagnosis present

## 2018-01-31 DIAGNOSIS — R112 Nausea with vomiting, unspecified: Secondary | ICD-10-CM | POA: Diagnosis not present

## 2018-01-31 DIAGNOSIS — E119 Type 2 diabetes mellitus without complications: Secondary | ICD-10-CM

## 2018-01-31 DIAGNOSIS — Z880 Allergy status to penicillin: Secondary | ICD-10-CM | POA: Diagnosis not present

## 2018-01-31 DIAGNOSIS — Z79899 Other long term (current) drug therapy: Secondary | ICD-10-CM | POA: Diagnosis not present

## 2018-01-31 DIAGNOSIS — E11628 Type 2 diabetes mellitus with other skin complications: Secondary | ICD-10-CM | POA: Diagnosis not present

## 2018-01-31 DIAGNOSIS — E1165 Type 2 diabetes mellitus with hyperglycemia: Secondary | ICD-10-CM | POA: Diagnosis present

## 2018-01-31 DIAGNOSIS — L732 Hidradenitis suppurativa: Secondary | ICD-10-CM | POA: Diagnosis present

## 2018-01-31 DIAGNOSIS — L0390 Cellulitis, unspecified: Secondary | ICD-10-CM

## 2018-01-31 LAB — CBC WITH DIFFERENTIAL/PLATELET
ABS IMMATURE GRANULOCYTES: 0.1 10*3/uL (ref 0.0–0.1)
Basophils Absolute: 0 10*3/uL (ref 0.0–0.1)
Basophils Relative: 0 %
Eosinophils Absolute: 0 10*3/uL (ref 0.0–0.7)
Eosinophils Relative: 0 %
HEMATOCRIT: 32.9 % — AB (ref 36.0–46.0)
HEMOGLOBIN: 10.6 g/dL — AB (ref 12.0–15.0)
Immature Granulocytes: 1 %
LYMPHS ABS: 1.8 10*3/uL (ref 0.7–4.0)
Lymphocytes Relative: 12 %
MCH: 27.2 pg (ref 26.0–34.0)
MCHC: 32.2 g/dL (ref 30.0–36.0)
MCV: 84.6 fL (ref 78.0–100.0)
MONOS PCT: 10 %
Monocytes Absolute: 1.5 10*3/uL — ABNORMAL HIGH (ref 0.1–1.0)
NEUTROS ABS: 11.9 10*3/uL — AB (ref 1.7–7.7)
Neutrophils Relative %: 77 %
Platelets: 267 10*3/uL (ref 150–400)
RBC: 3.89 MIL/uL (ref 3.87–5.11)
RDW: 12.9 % (ref 11.5–15.5)
WBC: 15.4 10*3/uL — ABNORMAL HIGH (ref 4.0–10.5)

## 2018-01-31 LAB — GLUCOSE, CAPILLARY
GLUCOSE-CAPILLARY: 192 mg/dL — AB (ref 65–99)
GLUCOSE-CAPILLARY: 143 mg/dL — AB (ref 65–99)
Glucose-Capillary: 255 mg/dL — ABNORMAL HIGH (ref 65–99)
Glucose-Capillary: 150 mg/dL — ABNORMAL HIGH (ref 65–99)
Glucose-Capillary: 199 mg/dL — ABNORMAL HIGH (ref 65–99)

## 2018-01-31 LAB — BASIC METABOLIC PANEL
Anion gap: 7 (ref 5–15)
BUN: 5 mg/dL — ABNORMAL LOW (ref 6–20)
CHLORIDE: 105 mmol/L (ref 101–111)
CO2: 24 mmol/L (ref 22–32)
CREATININE: 0.66 mg/dL (ref 0.44–1.00)
Calcium: 7.9 mg/dL — ABNORMAL LOW (ref 8.9–10.3)
GFR calc non Af Amer: >60 mL/min (ref >60)
Glucose, Bld: 271 mg/dL — ABNORMAL HIGH (ref 65–99)
Potassium: 3.4 mmol/L — ABNORMAL LOW (ref 3.5–5.1)
Sodium: 136 mmol/L (ref 135–145)

## 2018-01-31 LAB — I-STAT CG4 LACTIC ACID, ED: Lactic Acid, Venous: 0.88 mmol/L (ref 0.5–1.9)

## 2018-01-31 LAB — HEMOGLOBIN A1C
Hgb A1c MFr Bld: 12.1 % — ABNORMAL HIGH (ref 4.8–5.6)
Mean Plasma Glucose: 300.57 mg/dL

## 2018-01-31 LAB — HIV ANTIBODY (ROUTINE TESTING W REFLEX): HIV Screen 4th Generation wRfx: NONREACTIVE

## 2018-01-31 MED ORDER — ENOXAPARIN SODIUM 40 MG/0.4ML ~~LOC~~ SOLN
40.0000 mg | Freq: Every day | SUBCUTANEOUS | Status: DC
  Filled 2018-01-31 (×2): qty 0.4

## 2018-01-31 MED ORDER — ONDANSETRON HCL 4 MG/2ML IJ SOLN
4.0000 mg | Freq: Four times a day (QID) | INTRAMUSCULAR | Status: DC | PRN
  Administered 2018-01-31: 4 mg via INTRAVENOUS
  Filled 2018-01-31: qty 2

## 2018-01-31 MED ORDER — SODIUM CHLORIDE 0.9 % IV BOLUS (SEPSIS)
800.0000 mL | Freq: Once | INTRAVENOUS | Status: AC
  Administered 2018-01-31: 800 mL via INTRAVENOUS

## 2018-01-31 MED ORDER — SODIUM CHLORIDE 0.9 % IV BOLUS (SEPSIS)
1000.0000 mL | Freq: Once | INTRAVENOUS | Status: AC
  Administered 2018-01-31: 1000 mL via INTRAVENOUS

## 2018-01-31 MED ORDER — ACETAMINOPHEN 650 MG RE SUPP: 650.0000 mg | Freq: Four times a day (QID) | RECTAL | Status: DC | PRN

## 2018-01-31 MED ORDER — GUAIFENESIN 100 MG/5ML PO SOLN
5.0000 mL | ORAL | Status: DC | PRN
  Administered 2018-01-31 – 2018-02-01 (×3): 100 mg via ORAL
  Filled 2018-01-31 (×3): qty 5

## 2018-01-31 MED ORDER — ONDANSETRON HCL 4 MG/2ML IJ SOLN
4.0000 mg | Freq: Once | INTRAMUSCULAR | Status: AC
  Administered 2018-01-31: 4 mg via INTRAVENOUS
  Filled 2018-01-31: qty 2

## 2018-01-31 MED ORDER — SODIUM CHLORIDE 0.9 % IV SOLN
INTRAVENOUS | Status: AC
  Administered 2018-01-31: 07:00:00 via INTRAVENOUS

## 2018-01-31 MED ORDER — LEVOFLOXACIN IN D5W 750 MG/150ML IV SOLN
750.0000 mg | INTRAVENOUS | Status: DC
  Administered 2018-01-31 – 2018-02-01 (×2): 750 mg via INTRAVENOUS
  Filled 2018-01-31 (×2): qty 150

## 2018-01-31 MED ORDER — VANCOMYCIN HCL IN DEXTROSE 1-5 GM/200ML-% IV SOLN
1000.0000 mg | Freq: Once | INTRAVENOUS | Status: AC
Start: 2018-01-31 — End: 2018-01-31
  Administered 2018-01-31: 1000 mg via INTRAVENOUS
  Filled 2018-01-31: qty 200

## 2018-01-31 MED ORDER — ACETAMINOPHEN 325 MG PO TABS
650.0000 mg | ORAL_TABLET | Freq: Once | ORAL | Status: AC | PRN
  Administered 2018-01-31: 650 mg via ORAL
  Filled 2018-01-31: qty 2

## 2018-01-31 MED ORDER — IBUPROFEN 600 MG PO TABS
600.0000 mg | ORAL_TABLET | Freq: Once | ORAL | Status: AC
  Administered 2018-01-31: 600 mg via ORAL
  Filled 2018-01-31: qty 1

## 2018-01-31 MED ORDER — LEVOFLOXACIN IN D5W 750 MG/150ML IV SOLN
750.0000 mg | Freq: Once | INTRAVENOUS | Status: AC
  Administered 2018-01-31: 750 mg via INTRAVENOUS
  Filled 2018-01-31: qty 150

## 2018-01-31 MED ORDER — INSULIN ASPART 100 UNIT/ML ~~LOC~~ SOLN
4.0000 [IU] | Freq: Three times a day (TID) | SUBCUTANEOUS | Status: DC
  Administered 2018-01-31 – 2018-02-02 (×5): 4 [IU] via SUBCUTANEOUS

## 2018-01-31 MED ORDER — HYDROCODONE-ACETAMINOPHEN 5-325 MG PO TABS
1.0000 | ORAL_TABLET | ORAL | Status: DC | PRN
  Administered 2018-01-31 – 2018-02-02 (×4): 1 via ORAL
  Filled 2018-01-31 (×2): qty 1
  Filled 2018-01-31: qty 2
  Filled 2018-01-31: qty 1
  Filled 2018-01-31: qty 2

## 2018-01-31 MED ORDER — TRIAMCINOLONE ACETONIDE 0.1 % EX CREA
TOPICAL_CREAM | Freq: Three times a day (TID) | CUTANEOUS | Status: DC
Start: 2018-01-31 — End: 2018-02-02
  Administered 2018-01-31 – 2018-02-01 (×5): via TOPICAL
  Filled 2018-01-31: qty 15

## 2018-01-31 MED ORDER — ACETAMINOPHEN 325 MG PO TABS
650.0000 mg | ORAL_TABLET | Freq: Four times a day (QID) | ORAL | Status: DC | PRN
  Administered 2018-02-01: 650 mg via ORAL
  Filled 2018-01-31 (×3): qty 2

## 2018-01-31 MED ORDER — INSULIN ASPART 100 UNIT/ML ~~LOC~~ SOLN: 0.0000 [IU] | Freq: Every day | SUBCUTANEOUS | Status: DC

## 2018-01-31 MED ORDER — ONDANSETRON HCL 4 MG PO TABS: 4.0000 mg | ORAL_TABLET | Freq: Four times a day (QID) | ORAL | Status: DC | PRN

## 2018-01-31 MED ORDER — SENNOSIDES-DOCUSATE SODIUM 8.6-50 MG PO TABS: 1.0000 | ORAL_TABLET | Freq: Every evening | ORAL | Status: DC | PRN

## 2018-01-31 MED ORDER — SODIUM CHLORIDE 0.9 % IV BOLUS
500.0000 mL | Freq: Once | INTRAVENOUS | Status: AC
  Administered 2018-01-31: 500 mL via INTRAVENOUS

## 2018-01-31 MED ORDER — INSULIN GLARGINE 100 UNIT/ML ~~LOC~~ SOLN
20.0000 [IU] | Freq: Every day | SUBCUTANEOUS | Status: DC
  Administered 2018-01-31 – 2018-02-01 (×2): 20 [IU] via SUBCUTANEOUS
  Filled 2018-01-31 (×3): qty 0.2

## 2018-01-31 MED ORDER — VANCOMYCIN HCL IN DEXTROSE 1-5 GM/200ML-% IV SOLN
1000.0000 mg | Freq: Three times a day (TID) | INTRAVENOUS | Status: DC
  Administered 2018-01-31 – 2018-02-02 (×5): 1000 mg via INTRAVENOUS
  Filled 2018-01-31 (×7): qty 200

## 2018-01-31 MED ORDER — INSULIN ASPART 100 UNIT/ML ~~LOC~~ SOLN
0.0000 [IU] | Freq: Three times a day (TID) | SUBCUTANEOUS | Status: DC
Start: 2018-01-31 — End: 2018-02-02
  Administered 2018-01-31: 5 [IU] via SUBCUTANEOUS
  Administered 2018-01-31 (×2): 2 [IU] via SUBCUTANEOUS
  Administered 2018-02-01 (×2): 1 [IU] via SUBCUTANEOUS
  Administered 2018-02-01 – 2018-02-02 (×2): 2 [IU] via SUBCUTANEOUS

## 2018-01-31 NOTE — Progress Notes (Signed)
PROGRESS NOTE    Kara Hayes  ZOX:[id number] DOB: 1980-12-24 DOA: 01/30/2018 PCP: [facility]    Brief Narrative:  37 y.o. female with medical history significant for uncontrolled type 2 diabetes mellitus, now presenting to the emergency department for evaluation of malaise, nausea, nonbloody vomiting, and increasing redness, swelling, and pain involving the left axilla.  Patient reports that she developed redness and swelling involving the left axilla approximately 1 month ago, was seen in the emergency department at that time, found to have an abscess, refused I&D, and was discharged home where she completed a course of Bactrim.  She reports spreading erythema and increasing pain at the left axilla over the past week, along with subjective fevers and nausea with nonbloody vomiting.  ED Course: Upon arrival to the ED, patient is found to be febrile to 39.9 C, saturating well on room air, slightly tachycardic, and with systolic blood pressure of 87.  EKG features a sinus rhythm and chest x-ray is notable for hypoventilation and bibasilar atelectasis.  Chemistry panel is notable for a glucose of 350 and CBC features a leukocytosis to 16,800.  Urinalysis is notable for glucosuria, but not suggestive of infection.  Lactic acid is reassuring at 0.88.  ED physician performed bedside ultrasound of the left axilla and did not identify any underlying fluid collection.  Blood cultures were collected, 1.8 L normal saline given, and patient was started on empiric vancomycin and Levaquin in the setting of severe penicillin allergy.  Tachycardia resolved, blood pressure improved and has remained stable, and the patient will be admitted for ongoing evaluation and management of sepsis secondary to cellulitis.  Assessment & Plan:   Active Problems:   Sepsis due to cellulitis (HCC)   Type II diabetes mellitus (HCC)  1. Sepsis due to cellulitis  - Presents with malaise, fever, N/V,  and increasing redness in left axilla  - Found to have reassuringly normal lactate, clear lung exam, benign abdominal exam, UA without evidence for infection, and left axillary cellulitis   - No fluid-collection underlying the cellulitis on bedside US per ED physician  - Patient was given IV fluid hydration -Patient is continued on vancomycin and levofloxacin -We will continue regimen until patient afebrile, at which point would consider transitioning to doxycycline p.o., see below  2. Type II DM  - A1c noted to be 12.1. -Received input by diabetic coordinator.  Recommendation for 20 units of Lantus and 4 units of NovoLog before meals. - Follow CBG's and start Novolog per sliding-scale   3.  Hidradenitis -Left axilla with findings consistent with hidradenitis -Topical clindamycin not available at this time -When transitioning to oral antibiotics, will plan to transition to doxycycline which would also treat hidradenitis.  DVT prophylaxis: Lovenox subcutaneously Code Status: Full code Family Communication: Patient in room, family at bedside Disposition Plan: Uncertain at this time  Consultants:     Procedures:     Antimicrobials: Anti-infectives (From admission, onward)   Start     Dose/Rate Route Frequency Ordered Stop   01/31/18 2200  levofloxacin (LEVAQUIN) IVPB 750 mg     750 mg 100 mL/hr over 90 Minutes Intravenous Every 24 hours 01/31/18 0308     01/31/18 0800  vancomycin (VANCOCIN) IVPB 1000 mg/200 mL premix     1,000 mg 200 mL/hr over 60 Minutes Intravenous Every 8 hours 01/31/18 0308     01/31/18 0100  levofloxacin (LEVAQUIN) IVPB 750 mg     750 mg 100 mL/hr over 90  Minutes Intravenous  Once 01/31/18 0058 01/31/18 0250   01/31/18 0100  vancomycin (VANCOCIN) IVPB 1000 mg/200 mL premix     1,000 mg 200 mL/hr over 60 Minutes Intravenous  Once 01/31/18 0058 01/31/18 0228       Subjective: Eager to go home  Objective: Vitals:   01/31/18 0546 01/31/18 0819  01/31/18 1305 01/31/18 1711  BP: (!) 108/53  102/68 99/65  Pulse: 96  95 100  Resp: 17  14   Temp: (!) 102.4 F (39.1 C) 99.5 F (37.5 C) 100.3 F (37.9 C) (!) 100.5 F (38.1 C)  TempSrc: Oral Oral Oral Oral  SpO2: 100%  97% 98%  Weight:      Height:        Intake/Output Summary (Last 24 hours) at 01/31/2018 1750 Last data filed at 01/31/2018 4098 Gross per 24 hour  Intake 2040 ml  Output -  Net 2040 ml   Filed Weights   01/30/18 2135 01/31/18 0311  Weight: 104.3 kg (230 lb) 104.3 kg (230 lb)    Examination:  General exam: Appears calm and comfortable  Respiratory system: Clear to auscultation. Respiratory effort normal. Cardiovascular system: S1 & S2 heard, RRR. Gastrointestinal system: Abdomen is nondistended, soft and nontender. No organomegaly or masses felt. Normal bowel sounds heard. Central nervous system: Alert and oriented. No focal neurological deficits. Extremities: Symmetric 5 x 5 power. Skin: Thick tender erythematous patch of skin with underlying induration over left axilla  no active drainage Psychiatry: Judgement and insight appear normal. Mood & affect appropriate.   Data Reviewed: I have personally reviewed following labs and imaging studies  CBC: Recent Labs  Lab 01/30/18 2147 01/31/18 0747  WBC 16.8* 15.4*  NEUTROABS  --  11.9*  HGB 11.7* 10.6*  HCT 35.9* 32.9*  MCV 84.3 84.6  PLT 317 267   Basic Metabolic Panel: Recent Labs  Lab 01/30/18 2147 01/31/18 0747  NA 134* 136  K 3.7 3.4*  CL 101 105  CO2 25 24  GLUCOSE 350* 271*  BUN 9 5*  CREATININE 0.90 0.66  CALCIUM 8.3* 7.9*   GFR: Estimated Creatinine Clearance: 117.5 mL/min (by C-G formula based on SCr of 0.66 mg/dL). Liver Function Tests: Recent Labs  Lab 01/30/18 2147  AST 14*  ALT 12*  ALKPHOS 71  BILITOT 0.6  PROT 6.7  ALBUMIN 2.7*   Recent Labs  Lab 01/30/18 2147  LIPASE 20   No results for input(s): AMMONIA in the last 168 hours. Coagulation Profile: No  results for input(s): INR, PROTIME in the last 168 hours. Cardiac Enzymes: No results for input(s): CKTOTAL, CKMB, CKMBINDEX, TROPONINI in the last 168 hours. BNP (last 3 results) No results for input(s): PROBNP in the last 8760 hours. HbA1C: Recent Labs    01/31/18 0747  HGBA1C 12.1*   CBG: Recent Labs  Lab 01/31/18 0745 01/31/18 1156 01/31/18 1449 01/31/18 1720  GLUCAP 255* 150* 192* 199*   Lipid Profile: No results for input(s): CHOL, HDL, LDLCALC, TRIG, CHOLHDL, LDLDIRECT in the last 72 hours. Thyroid Function Tests: No results for input(s): TSH, T4TOTAL, FREET4, T3FREE, THYROIDAB in the last 72 hours. Anemia Panel: No results for input(s): VITAMINB12, FOLATE, FERRITIN, TIBC, IRON, RETICCTPCT in the last 72 hours. Sepsis Labs: Recent Labs  Lab 01/31/18 0125  LATICACIDVEN 0.88    No results found for this or any previous visit (from the past 240 hour(s)).   Radiology Studies: Dg Chest Port 1 View  Result Date: 01/31/2018 CLINICAL DATA:  Fever. EXAM:  PORTABLE CHEST 1 VIEW COMPARISON:  10/22/2017 FINDINGS: The cardiomediastinal contours are normal. Low lung volumes with mild bibasilar atelectasis. Pulmonary vasculature is normal. No consolidation, pleural effusion, or pneumothorax. No acute osseous abnormalities are seen. IMPRESSION: Hypoventilatory chest with bibasilar atelectasis. Electronically Signed   By: Rubye OaksMelanie  Ehinger M.D.   On: 01/31/2018 01:15    Scheduled Meds: . enoxaparin (LOVENOX) injection  40 mg Subcutaneous Daily  . insulin aspart  0-5 Units Subcutaneous QHS  . insulin aspart  0-9 Units Subcutaneous TID WC  . insulin aspart  4 Units Subcutaneous TID WC  . insulin glargine  20 Units Subcutaneous QHS  . triamcinolone cream   Topical TID   Continuous Infusions: . levofloxacin (LEVAQUIN) IV    . vancomycin Stopped (01/31/18 0912)     LOS: 0 days   Rickey BarbaraStephen Javarus Dorner, MD [facility] Pager [phone]  If 7PM-7AM, please contact  night-coverage [email or web address] Password [email or web address] 01/31/2018, 5:50 PM

## 2018-01-31 NOTE — H&P (Signed)
History and Physical    Kara Hayes ZOX:[id number] DOB: Jan 08, 1981 DOA: 01/30/2018  PCP: [facility]   Patient coming from: Home  Chief Complaint: Redness and tenderness in left axilla, malaise, fevers, N/V   HPI: Kara Hayes is a 37 y.o. female with medical history significant for uncontrolled type 2 diabetes mellitus, now presenting to the emergency department for evaluation of malaise, nausea, nonbloody vomiting, and increasing redness, swelling, and pain involving the left axilla.  Patient reports that she developed redness and swelling involving the left axilla approximately 1 month ago, was seen in the emergency department at that time, found to have an abscess, refused I&D, and was discharged home where she completed a course of Bactrim.  She reports spreading erythema and increasing pain at the left axilla over the past week, along with subjective fevers and nausea with nonbloody vomiting.  ED Course: Upon arrival to the ED, patient is found to be febrile to 39.9 C, saturating well on room air, slightly tachycardic, and with systolic blood pressure of 87.  EKG features a sinus rhythm and chest x-ray is notable for hypoventilation and bibasilar atelectasis.  Chemistry panel is notable for a glucose of 350 and CBC features a leukocytosis to 16,800.  Urinalysis is notable for glucosuria, but not suggestive of infection.  Lactic acid is reassuring at 0.88.  ED physician performed bedside ultrasound of the left axilla and did not identify any underlying fluid collection.  Blood cultures were collected, 1.8 L normal saline given, and patient was started on empiric vancomycin and Levaquin in the setting of severe penicillin allergy.  Tachycardia resolved, blood pressure improved and has remained stable, and the patient will be admitted for ongoing evaluation and management of sepsis secondary to cellulitis.  Review of Systems:  All other systems reviewed and apart  from HPI, are negative.  Past Medical History:  Diagnosis Date  . Bacterial vaginosis   . Diabetes mellitus without complication [facility])     Past Surgical History:  Procedure Laterality Date  . CESAREAN SECTION       reports that she has never smoked. She has never used smokeless tobacco. She reports that she drinks alcohol. She reports that she does not use drugs.  Allergies  Allergen Reactions  . Amoxicillin Itching and Swelling  . Penicillins Itching and Swelling    Has patient had a PCN reaction causing immediate rash, facial/tongue/throat swelling, SOB or lightheadedness with hypotension: Yes Has patient had a PCN reaction causing severe rash involving mucus membranes or skin necrosis: No Has patient had a PCN reaction that required hospitalization Yes- had to get a shot in ED Has patient had a PCN reaction occurring within the last 10 years: unknown If all of the above answers are "NO", then may proceed with Cephalosporin use.   . Reglan [Metoclopramide] Itching and Swelling    Family History  Problem Relation Age of Onset  . Diabetes Mother   . Diabetes Father      Prior to Admission medications   Medication Sig Start Date End Date Taking? Authorizing Provider  acetaminophen (TYLENOL) 500 MG tablet Take 1,000 mg by mouth every 6 (six) hours as needed for headache (pain).    [provider]  HYDROcodone-acetaminophen (NORCO/VICODIN) 5-325 MG tablet Take 1-2 tablets by mouth every 6 (six) hours as needed for severe pain. 01/08/18   Joy, Shawn C, PA-C  ibuprofen (ADVIL,MOTRIN) 200 MG tablet Take 400 mg by mouth every 6 (six) hours as needed  for headache, mild pain or moderate pain.    [provider]  nystatin-triamcinolone Arvilla [employer](MYCOLOG II) cream Apply to affected area daily 11/03/17   Mesner, Barbara CowerJason, MD    Physical Exam: Vitals:   01/30/18 2349 01/31/18 0015 01/31/18 0045 01/31/18 0139  BP: 96/60 95/66 (!) 87/51 106/66  Pulse: (!) 101 95 100 92  Resp: 20      Temp: (!) 103 F (39.4 C)     TempSrc: Oral     SpO2: 100% 99% 99% 100%  Weight:      Height:          Constitutional: NAD, calm Eyes: PERTLA, lids and conjunctivae normal ENMT: Mucous membranes are moist. Posterior pharynx clear of any exudate or lesions.   Neck: normal, supple, no masses, no thyromegaly Respiratory: clear to auscultation bilaterally, no wheezing, no crackles. Normal respiratory effort.   Cardiovascular: S1 & S2 heard, regular rate and rhythm. No extremity edema.  Abdomen: No distension, no tenderness, soft. Bowel sounds normal.  Musculoskeletal: no clubbing / cyanosis. No joint deformity upper and lower extremities.   Skin: erythema and tenderness involving left axilla. Warm, dry, well-perfused. Neurologic: No facial asymmetry. Sensation intact. Moving all extremities.  Psychiatric: Alert and oriented to person, place, and situation. Withdrawn, poorly cooperative with interview.     Labs on Admission: I have personally reviewed following labs and imaging studies  CBC: Recent Labs  Lab 01/30/18 2147  WBC 16.8*  HGB 11.7*  HCT 35.9*  MCV 84.3  PLT 317   Basic Metabolic Panel: Recent Labs  Lab 01/30/18 2147  NA 134*  K 3.7  CL 101  CO2 25  GLUCOSE 350*  BUN 9  CREATININE 0.90  CALCIUM 8.3*   GFR: Estimated Creatinine Clearance: 104.4 mL/min (by C-G formula based on SCr of 0.9 mg/dL). Liver Function Tests: Recent Labs  Lab 01/30/18 2147  AST 14*  ALT 12*  ALKPHOS 71  BILITOT 0.6  PROT 6.7  ALBUMIN 2.7*   Recent Labs  Lab 01/30/18 2147  LIPASE 20   No results for input(s): AMMONIA in the last 168 hours. Coagulation Profile: No results for input(s): INR, PROTIME in the last 168 hours. Cardiac Enzymes: No results for input(s): CKTOTAL, CKMB, CKMBINDEX, TROPONINI in the last 168 hours. BNP (last 3 results) No results for input(s): PROBNP in the last 8760 hours. HbA1C: No results for input(s): HGBA1C in the last 72  hours. CBG: No results for input(s): GLUCAP in the last 168 hours. Lipid Profile: No results for input(s): CHOL, HDL, LDLCALC, TRIG, CHOLHDL, LDLDIRECT in the last 72 hours. Thyroid Function Tests: No results for input(s): TSH, T4TOTAL, FREET4, T3FREE, THYROIDAB in the last 72 hours. Anemia Panel: No results for input(s): VITAMINB12, FOLATE, FERRITIN, TIBC, IRON, RETICCTPCT in the last 72 hours. Urine analysis:    Component Value Date/Time   COLORURINE YELLOW 01/30/2018 2144   APPEARANCEUR HAZY (A) 01/30/2018 2144   LABSPEC 1.028 01/30/2018 2144   PHURINE 6.0 01/30/2018 2144   GLUCOSEU >=500 (A) 01/30/2018 2144   HGBUR SMALL (A) 01/30/2018 2144   BILIRUBINUR NEGATIVE 01/30/2018 2144   KETONESUR NEGATIVE 01/30/2018 2144   PROTEINUR NEGATIVE 01/30/2018 2144   UROBILINOGEN 1.0 07/10/2015 1858   NITRITE NEGATIVE 01/30/2018 2144   LEUKOCYTESUR NEGATIVE 01/30/2018 2144   Sepsis Labs: @LABRCNTIP (procalcitonin:4,lacticidven:4) )No results found for this or any previous visit (from the past 240 hour(s)).   Radiological Exams on Admission: Dg Chest Port 1 View  Result Date: 01/31/2018 CLINICAL DATA:  Fever. EXAM:  PORTABLE CHEST 1 VIEW COMPARISON:  10/22/2017 FINDINGS: The cardiomediastinal contours are normal. Low lung volumes with mild bibasilar atelectasis. Pulmonary vasculature is normal. No consolidation, pleural effusion, or pneumothorax. No acute osseous abnormalities are seen. IMPRESSION: Hypoventilatory chest with bibasilar atelectasis. Electronically Signed   By: Rubye Oaks M.D.   On: 01/31/2018 01:15    EKG: Independently reviewed. Sinus rhythm.   Assessment/Plan   1. Sepsis due to cellulitis  - Presents with malaise, fever, N/V, and increasing redness in left axilla  - Found to have reassuringly normal lactate, clear lung exam, benign abdominal exam, UA without evidence for infection, and left axillary cellulitis   - No fluid-collection underlying the cellulitis on  bedside US per ED physician  - Cultured, treated with 1.8 liters NS, and started on empiric broad-spectrum abx  - Continue current abx while following cultures and clinical course    2. Type II DM  - No A1c on file, serum glucose 350 on admission  - Follow CBG's and start Novolog per sliding-scale    DVT prophylaxis: Lovenox Code Status: Full  Family Communication: Discussed with patient Consults called: None Admission status: Inpatient    Briscoe Deutscher, MD [facility] Pager [phone]  If 7PM-7AM, please contact night-coverage [email or web address] Password [email or web address]  01/31/2018, 2:18 AM

## 2018-01-31 NOTE — Progress Notes (Signed)
Pharmacy Antibiotic Note  Kara Hayes is a 37 y.o. female admitted on 01/30/2018 with sepsis and cellulitis.  Pharmacy has been consulted for Vancomycin/Levaquin dosing. WBC elevated. Renal function good. Left axillary cellulitis.   Plan: Vancomycin 1000 mg IV q8h Levaquin 750 mg IV q24h Trend WBC, temp, renal function  F/U infectious work-up Drug levels as indicated  Height: 5\' 6"  (167.6 cm) Weight: 230 lb (104.3 kg) IBW/kg (Calculated) : 59.3  Temp (24hrs), Avg:101 F (38.3 C), Min:98.9 F (37.2 C), Max:103 F (39.4 C)  Recent Labs  Lab 01/30/18 2147 01/31/18 0125  WBC 16.8*  --   CREATININE 0.90  --   LATICACIDVEN  --  0.88    Estimated Creatinine Clearance: 104.4 mL/min (by C-G formula based on SCr of 0.9 mg/dL).    Allergies  Allergen Reactions  . Amoxicillin Itching and Swelling  . Penicillins Itching and Swelling    Has patient had a PCN reaction causing immediate rash, facial/tongue/throat swelling, SOB or lightheadedness with hypotension: Yes Has patient had a PCN reaction causing severe rash involving mucus membranes or skin necrosis: No Has patient had a PCN reaction that required hospitalization Yes- had to get a shot in ED Has patient had a PCN reaction occurring within the last 10 years: unknown If all of the above answers are "NO", then may proceed with Cephalosporin use.   . Reglan [Metoclopramide] Itching and Swelling    Kara Hayes, Kara Hayes 01/31/2018 3:05 AM

## 2018-01-31 NOTE — Progress Notes (Addendum)
0310 pt received from ED complaining pain L axilla 10/10.  Area red and swollen. Husband at bedside.  0559 notified provider temp 102.4, BP 108/53, HR 96. Too soon to give repeat Tylenol.

## 2018-01-31 NOTE — ED Notes (Signed)
ED Provider at bedside. 

## 2018-01-31 NOTE — ED Provider Notes (Addendum)
[facility]  CSN: [id number] Arrival date & time: 01/30/18 2124  Chief Complaint(s) Emesis  HPI Kara Hayes is a 37 y.o. female   The history is provided by the patient.  Emesis   This is a new problem. The current episode started 2 days ago. The problem occurs 5 to 10 times per day. The problem has not changed since onset.The emesis has an appearance of stomach contents. Maximum temperature: subjective. Associated symptoms include chills, cough, a fever and myalgias. Pertinent negatives include no abdominal pain and no diarrhea.   Left Axillary abscess treated with Bactrim.  Patient reports that she has noticed that the redness has spread.   Past Medical History Past Medical History:  Diagnosis Date  . Bacterial vaginosis   . Diabetes mellitus without complication (HCC)    There are no active problems to display for this patient.  Home Medication(s) Prior to Admission medications   Medication Sig Start Date End Date Taking? Authorizing Provider  acetaminophen (TYLENOL) 500 MG tablet Take 1,000 mg by mouth every 6 (six) hours as needed for headache (pain).    [provider]  HYDROcodone-acetaminophen (NORCO/VICODIN) 5-325 MG tablet Take 1-2 tablets by mouth every 6 (six) hours as needed for severe pain. 01/08/18   Joy, Shawn C, PA-C  ibuprofen (ADVIL,MOTRIN) 200 MG tablet Take 400 mg by mouth every 6 (six) hours as needed for headache, mild pain or moderate pain.    [provider]  nystatin-triamcinolone Arvilla [employer](MYCOLOG II) cream Apply to affected area daily 11/03/17   Mesner, Barbara CowerJason, MD                                                                                                                                    Past Surgical History Past Surgical History:  Procedure Laterality Date  . CESAREAN SECTION     Family History Family History  Problem Relation Age of Onset  . Diabetes Mother   . Diabetes Father       Social History Social History   Tobacco Use  . Smoking status: Never Smoker  . Smokeless tobacco: Never Used  Substance Use Topics  . Alcohol use: Yes    Comment: socially  . Drug use: No   Allergies Amoxicillin; Penicillins; and Reglan [metoclopramide]  Review of Systems Review of Systems  Constitutional: Positive for chills and fever.  Respiratory: Positive for cough.   Gastrointestinal: Positive for vomiting. Negative for abdominal pain and diarrhea.  Musculoskeletal: Positive for myalgias.  All other systems are reviewed and are negative for acute change except as noted in the HPI  Physical Exam Vital Signs  I have reviewed the triage vital signs BP 87/51   Pulse (!) 101   Temp (!) 103 F (39.4 C) (Oral)   Resp 20   Ht 5\' 6"  (1.676 m)   Wt 104.3 kg (230 lb)   LMP 12/01/2017  SpO2 100%   BMI 37.12 kg/m   Physical Exam  Constitutional: She is oriented to person, place, and time. She appears well-developed and well-nourished. No distress.  HENT:  Head: Normocephalic and atraumatic.  Nose: Nose normal.  Eyes: Pupils are equal, round, and reactive to light. Conjunctivae and EOM are normal. Right eye exhibits no discharge. Left eye exhibits no discharge. No scleral icterus.  Neck: Normal range of motion. Neck supple.  Cardiovascular: Normal rate and regular rhythm. Exam reveals no gallop and no friction rub.  No murmur heard. Pulmonary/Chest: Effort normal and breath sounds normal. No stridor. No respiratory distress. She has no rales.  Abdominal: Soft. She exhibits no distension. There is no tenderness.  Musculoskeletal: She exhibits no edema or tenderness.  Neurological: She is alert and oriented to person, place, and time.  Skin: Skin is warm and dry. No rash noted. She is not diaphoretic. There is erythema.  Large area of erythema in the left axilla and lateral chest wall measuring approximately 12 to 15 cm with underlying induration but no fluctuance.   This area is tender to palpation.  Psychiatric: She has a normal mood and affect.  Vitals reviewed.   ED Results and Treatments Labs (all labs ordered are listed, but only abnormal results are displayed) Labs Reviewed  COMPREHENSIVE METABOLIC PANEL - Abnormal; Notable for the following components:      Result Value   Sodium 134 (*)    Glucose, Bld 350 (*)    Calcium 8.3 (*)    Albumin 2.7 (*)    AST 14 (*)    ALT 12 (*)    All other components within normal limits  CBC - Abnormal; Notable for the following components:   WBC 16.8 (*)    Hemoglobin 11.7 (*)    HCT 35.9 (*)    All other components within normal limits  URINALYSIS, ROUTINE W REFLEX MICROSCOPIC - Abnormal; Notable for the following components:   APPearance HAZY (*)    Glucose, UA >=500 (*)    Hgb urine dipstick SMALL (*)    All other components within normal limits  I-STAT BETA HCG BLOOD, ED (MC, WL, AP ONLY) - Abnormal; Notable for the following components:   I-stat hCG, quantitative 16.5 (*)    All other components within normal limits  CULTURE, BLOOD (ROUTINE X 2)  CULTURE, BLOOD (ROUTINE X 2)  LIPASE, BLOOD  I-STAT CG4 LACTIC ACID, ED                                                                                                                         EKG  EKG Interpretation  Date/Time:  Friday January 31 2018 01:43:55 EDT Ventricular Rate:  95 PR Interval:    QRS Duration: 85 QT Interval:  347 QTC Calculation: 437 R Axis:   65 Text Interpretation:  Sinus rhythm No significant change since last tracing Confirmed by Drema Pry [phone]) on 01/31/2018 1:54:25 AM      Radiology  Dg Chest Port 1 View  Result Date: 01/31/2018 CLINICAL DATA:  Fever. EXAM: PORTABLE CHEST 1 VIEW COMPARISON:  10/22/2017 FINDINGS: The cardiomediastinal contours are normal. Low lung volumes with mild bibasilar atelectasis. Pulmonary vasculature is normal. No consolidation, pleural effusion, or pneumothorax. No acute osseous  abnormalities are seen. IMPRESSION: Hypoventilatory chest with bibasilar atelectasis. Electronically Signed   By: Rubye Oaks M.D.   On: 01/31/2018 01:15   Pertinent labs & imaging results that were available during my care of the patient were reviewed by me and considered in my medical decision making (see chart for details).  Medications Ordered in ED Medications  sodium chloride 0.9 % bolus 1,000 mL (1,000 mLs Intravenous New Bag/Given 01/31/18 0120)    And  sodium chloride 0.9 % bolus 800 mL (800 mLs Intravenous New Bag/Given 01/31/18 0153)  levofloxacin (LEVAQUIN) IVPB 750 mg (750 mg Intravenous New Bag/Given 01/31/18 0120)  vancomycin (VANCOCIN) IVPB 1000 mg/200 mL premix (1,000 mg Intravenous New Bag/Given 01/31/18 0124)  ondansetron (ZOFRAN) injection 4 mg (has no administration in time range)  acetaminophen (TYLENOL) tablet 650 mg (650 mg Oral Given 01/31/18 0005)                                                                                                                                    Procedures Procedures  EMERGENCY DEPARTMENT US SOFT TISSUE INTERPRETATION "Study: Limited Soft Tissue Ultrasound"  INDICATIONS: Soft tissue infection Multiple views of the body part were obtained in real-time with a multi-frequency linear probe  PERFORMED BY: Myself IMAGES ARCHIVED?: Yes SIDE:Left BODY PART:Axilla and Chest wall INTERPRETATION:  No abcess noted and Cellulitis present  (including critical care time)   CRITICAL CARE Performed by: Amadeo Garnet Cardama Total critical care time: 35 minutes Critical care time was exclusive of separately billable procedures and treating other patients. Critical care was necessary to treat or prevent imminent or life-threatening deterioration. Critical care was time spent personally by me on the following activities: development of treatment plan with patient and/or surrogate as well as nursing, discussions with consultants, evaluation of  patient's response to treatment, examination of patient, obtaining history from patient or surrogate, ordering and performing treatments and interventions, ordering and review of laboratory studies, ordering and review of radiographic studies, pulse oximetry and re-evaluation of patient's condition.  Medical Decision Making / ED Course I have reviewed the nursing notes for this encounter and the patient's prior records (if available in EHR or on provided paperwork).    Patient is febrile and hypotensive with systolics in the 70s to 80s.  Lungs clear to auscultation bilaterally.  Abdomen benign.  Only source of possible infection is left axilla cellulitis.  Labs that were obtained in triage revealed leukocytosis at 16,000.  UA without evidence of infection.  Hyperglycemia without evidence of DKA.  No other significant electrolyte derangements.  No renal insufficiency.  Code sepsis initiated and patient started on empiric antibiotics.  30 cc/kg of ideal body weight given.  During first IV fluid bolus, patient's blood pressure responded well with systolics in the low 100s.  Will admit to medicine for continued management.  Final Clinical Impression(s) / ED Diagnoses Final diagnoses:  Sepsis, due to unspecified organism (HCC)  Cellulitis of left axilla  Hyperglycemia  Non-intractable vomiting with nausea, unspecified vomiting type      This chart was dictated using voice recognition software.  Despite best efforts to proofread,  errors can occur which can change the documentation meaning.     Nira Conn, MD 01/31/18 [phone]

## 2018-01-31 NOTE — ED Notes (Signed)
MD Opyd at bedside. 

## 2018-01-31 NOTE — Progress Notes (Signed)
Inpatient Diabetes Program Recommendations  AACE/ADA: New Consensus Statement on Inpatient Glycemic Control (2015)  Target Ranges:  Prepandial:   less than 140 mg/dL      Peak postprandial:   less than 180 mg/dL (1-2 hours)      Critically ill patients:  140 - 180 mg/dL   Lab Results  Component Value Date   GLUCAP 192 (H) 01/31/2018   HGBA1C 12.1 (H) 01/31/2018    Review of Glycemic Control Results for Mylinda LatinaJACKSON, Laloni (MRN [id number]) as of 01/31/2018 15:33  Ref. Range 01/31/2018 07:45 01/31/2018 11:56 01/31/2018 14:49  Glucose-Capillary Latest Ref Range: 65 - 99 mg/dL 657255 (H) 846150 (H) 962192 (H)   Diabetes history: Type 2 DM  Outpatient Diabetes medications: Lantus 16 units three times a day Current orders for Inpatient glycemic control:  Novolog sensitive tid with meals and HS  Inpatient Diabetes Program Recommendations:    Spoke with patient regarding elevated A1C.  She states that since moving to [place] her blood sugars have not been good.  She states that there was a mix-up with medications that MD ordered and that she currently only has Lantus but "it does not work".  In the past she was on Lantus once a day and Novolog.  She believes that her blood sugars were much better with Lantus/Novolog.  We discussed the difference in insulins.  I agree that she likely needs both basal and bolus insulin for glycemic control.  Patient understands the importance of glycemic control. Based on patient's weight consider adding:  Lantus 20 units q HS and Novolog 4 units tid with meals (hold if patient eats less than (50%).  Patient states that she would like a new MD for follow-up after d/c that takes Medicaid.  Text page sent to MD.  Thanks,  Beryl MeagerJenny Vu Liebman, RN, BC-ADM Inpatient Diabetes Coordinator Pager [phone] (8a-5p)

## 2018-02-01 LAB — GLUCOSE, CAPILLARY
GLUCOSE-CAPILLARY: 133 mg/dL — AB (ref 65–99)
GLUCOSE-CAPILLARY: 124 mg/dL — AB (ref 65–99)
Glucose-Capillary: 146 mg/dL — ABNORMAL HIGH (ref 65–99)
Glucose-Capillary: 157 mg/dL — ABNORMAL HIGH (ref 65–99)

## 2018-02-01 LAB — BASIC METABOLIC PANEL
ANION GAP: 8 (ref 5–15)
BUN: <5 mg/dL — ABNORMAL LOW (ref 6–20)
CO2: 26 mmol/L (ref 22–32)
CREATININE: 0.72 mg/dL (ref 0.44–1.00)
Calcium: 8.2 mg/dL — ABNORMAL LOW (ref 8.9–10.3)
Chloride: 102 mmol/L (ref 101–111)
GFR calc non Af Amer: >60 mL/min (ref >60)
Glucose, Bld: 132 mg/dL — ABNORMAL HIGH (ref 65–99)
POTASSIUM: 3.3 mmol/L — AB (ref 3.5–5.1)
SODIUM: 136 mmol/L (ref 135–145)

## 2018-02-01 LAB — CBC
HEMATOCRIT: 32.3 % — AB (ref 36.0–46.0)
HEMOGLOBIN: 10.3 g/dL — AB (ref 12.0–15.0)
MCH: 27.5 pg (ref 26.0–34.0)
MCHC: 31.9 g/dL (ref 30.0–36.0)
MCV: 86.4 fL (ref 78.0–100.0)
PLATELETS: 308 10*3/uL (ref 150–400)
RBC: 3.74 MIL/uL — AB (ref 3.87–5.11)
RDW: 12.9 % (ref 11.5–15.5)
WBC: 21.2 10*3/uL — AB (ref 4.0–10.5)

## 2018-02-01 MED ORDER — DIPHENHYDRAMINE HCL 25 MG PO CAPS
25.0000 mg | ORAL_CAPSULE | Freq: Four times a day (QID) | ORAL | Status: DC | PRN
  Administered 2018-02-01 – 2018-02-02 (×3): 25 mg via ORAL
  Filled 2018-02-01 (×3): qty 1

## 2018-02-01 MED ORDER — POTASSIUM CHLORIDE CRYS ER 20 MEQ PO TBCR
40.0000 meq | EXTENDED_RELEASE_TABLET | Freq: Two times a day (BID) | ORAL | Status: AC
  Administered 2018-02-01 (×2): 40 meq via ORAL
  Filled 2018-02-01 (×2): qty 2

## 2018-02-01 NOTE — Progress Notes (Signed)
PROGRESS NOTE    Kara Hayes  ZOX:[id number] DOB: 07-03-1981 DOA: 01/30/2018 PCP: [facility]    Brief Narrative:  37 y.o. female with medical history significant for uncontrolled type 2 diabetes mellitus, now presenting to the emergency department for evaluation of malaise, nausea, nonbloody vomiting, and increasing redness, swelling, and pain involving the left axilla.  Patient reports that she developed redness and swelling involving the left axilla approximately 1 month ago, was seen in the emergency department at that time, found to have an abscess, refused I&D, and was discharged home where she completed a course of Bactrim.  She reports spreading erythema and increasing pain at the left axilla over the past week, along with subjective fevers and nausea with nonbloody vomiting.  ED Course: Upon arrival to the ED, patient is found to be febrile to 39.9 C, saturating well on room air, slightly tachycardic, and with systolic blood pressure of 87.  EKG features a sinus rhythm and chest x-ray is notable for hypoventilation and bibasilar atelectasis.  Chemistry panel is notable for a glucose of 350 and CBC features a leukocytosis to 16,800.  Urinalysis is notable for glucosuria, but not suggestive of infection.  Lactic acid is reassuring at 0.88.  ED physician performed bedside ultrasound of the left axilla and did not identify any underlying fluid collection.  Blood cultures were collected, 1.8 L normal saline given, and patient was started on empiric vancomycin and Levaquin in the setting of severe penicillin allergy.  Tachycardia resolved, blood pressure improved and has remained stable, and the patient will be admitted for ongoing evaluation and management of sepsis secondary to cellulitis.  Assessment & Plan:   Active Problems:   Sepsis due to cellulitis (HCC)   Type II diabetes mellitus (HCC)  1. Sepsis due to cellulitis  - Presents with malaise, fever, N/V,  and increasing redness in left axilla  - Found to have reassuringly normal lactate, clear lung exam, benign abdominal exam, UA without evidence for infection, and left axillary cellulitis   - No fluid-collection underlying the cellulitis on bedside US per ED physician  - Patient was given IV fluid hydration -Patient is continued on vancomycin and levofloxacin -Fevers improving although white blood count has worsened to 21,000 today. -Blood cultures pending, no growth x1 day  2. Type II DM  - A1c noted to be 12.1. -Received input by diabetic coordinator.  Recommendation for 20 units of Lantus and 4 units of NovoLog before meals. - Leukosis improved -Continue with sliding scale insulin as needed  3.  Hidradenitis -Left axilla with findings consistent with hidradenitis, chart reviewed patient with similar presenting problem 1 month prior. -Topical clindamycin not available at this facility -When transitioning to oral antibiotics, will plan to transition to doxycycline which would also cover for hidradenitis.  DVT prophylaxis: Lovenox subcutaneously Code Status: Full code Family Communication: Patient in room, family at bedside Disposition Plan: Uncertain at this time  Consultants:     Procedures:     Antimicrobials: Anti-infectives (From admission, onward)   Start     Dose/Rate Route Frequency Ordered Stop   01/31/18 2200  levofloxacin (LEVAQUIN) IVPB 750 mg     750 mg 100 mL/hr over 90 Minutes Intravenous Every 24 hours 01/31/18 0308     01/31/18 0800  vancomycin (VANCOCIN) IVPB 1000 mg/200 mL premix     1,000 mg 200 mL/hr over 60 Minutes Intravenous Every 8 hours 01/31/18 0308     01/31/18 0100  levofloxacin (LEVAQUIN) IVPB  750 mg     750 mg 100 mL/hr over 90 Minutes Intravenous  Once 01/31/18 0058 01/31/18 0250   01/31/18 0100  vancomycin (VANCOCIN) IVPB 1000 mg/200 mL premix     1,000 mg 200 mL/hr over 60 Minutes Intravenous  Once 01/31/18 0058 01/31/18 0228       Subjective: Wanting to go home  Objective: Vitals:   02/01/18 0539 02/01/18 1215 02/01/18 1326 02/01/18 1647  BP: (!) 91/55 (!) 101/54 110/66   Pulse: 89 93 66   Resp: 18 17 15    Temp: 100.2 F (37.9 C) (!) 102.6 F (39.2 C) 99.6 F (37.6 C) 98.6 F (37 C)  TempSrc: Oral Oral Oral Oral  SpO2: 97% 99% 100%   Weight:      Height:        Intake/Output Summary (Last 24 hours) at 02/01/2018 1813 Last data filed at 02/01/2018 1326 Gross per 24 hour  Intake 1070 ml  Output 750 ml  Net 320 ml   Filed Weights   01/30/18 2135 01/31/18 0311  Weight: 104.3 kg (230 lb) 104.3 kg (230 lb)    Examination: General exam: Conversant, in no acute distress Respiratory system: normal chest rise, clear, no audible wheezing Cardiovascular system: regular rhythm, s1-s2 Gastrointestinal system: Nondistended, nontender, pos BS Central nervous system: No seizures, no tremors Extremities: No cyanosis, no joint deformities Skin: No rashes, no pallor Psychiatry: Affect normal // no auditory hallucinations    Data Reviewed: I have personally reviewed following labs and imaging studies  CBC: Recent Labs  Lab 01/30/18 2147 01/31/18 0747 02/01/18 0518  WBC 16.8* 15.4* 21.2*  NEUTROABS  --  11.9*  --   HGB 11.7* 10.6* 10.3*  HCT 35.9* 32.9* 32.3*  MCV 84.3 84.6 86.4  PLT 317 267 308   Basic Metabolic Panel: Recent Labs  Lab 01/30/18 2147 01/31/18 0747 02/01/18 0518  NA 134* 136 136  K 3.7 3.4* 3.3*  CL 101 105 102  CO2 25 24 26   GLUCOSE 350* 271* 132*  BUN 9 5* <5*  CREATININE 0.90 0.66 0.72  CALCIUM 8.3* 7.9* 8.2*   GFR: Estimated Creatinine Clearance: 117.5 mL/min (by C-G formula based on SCr of 0.72 mg/dL). Liver Function Tests: Recent Labs  Lab 01/30/18 2147  AST 14*  ALT 12*  ALKPHOS 71  BILITOT 0.6  PROT 6.7  ALBUMIN 2.7*   Recent Labs  Lab 01/30/18 2147  LIPASE 20   No results for input(s): AMMONIA in the last 168 hours. Coagulation Profile: No  results for input(s): INR, PROTIME in the last 168 hours. Cardiac Enzymes: No results for input(s): CKTOTAL, CKMB, CKMBINDEX, TROPONINI in the last 168 hours. BNP (last 3 results) No results for input(s): PROBNP in the last 8760 hours. HbA1C: Recent Labs    01/31/18 0747  HGBA1C 12.1*   CBG: Recent Labs  Lab 01/31/18 1720 01/31/18 2124 02/01/18 0806 02/01/18 1221 02/01/18 1646  GLUCAP 199* 143* 146* 157* 133*   Lipid Profile: No results for input(s): CHOL, HDL, LDLCALC, TRIG, CHOLHDL, LDLDIRECT in the last 72 hours. Thyroid Function Tests: No results for input(s): TSH, T4TOTAL, FREET4, T3FREE, THYROIDAB in the last 72 hours. Anemia Panel: No results for input(s): VITAMINB12, FOLATE, FERRITIN, TIBC, IRON, RETICCTPCT in the last 72 hours. Sepsis Labs: Recent Labs  Lab 01/31/18 0125  LATICACIDVEN 0.88    Recent Results (from the past 240 hour(s))  Blood Culture (routine x 2)     Status: None (Preliminary result)   Collection Time: 01/31/18  1:10 AM  Result Value Ref Range Status   Specimen Description BLOOD LEFT ANTECUBITAL  Final   Special Requests   Final    BOTTLES DRAWN AEROBIC AND ANAEROBIC Blood Culture adequate volume   Culture   Final    NO GROWTH 1 DAY Performed at [facility], [address] [place], [place] [address]    Report Status PENDING  Incomplete  Blood Culture (routine x 2)     Status: None (Preliminary result)   Collection Time: 01/31/18  1:17 AM  Result Value Ref Range Status   Specimen Description BLOOD RIGHT ANTECUBITAL  Final   Special Requests   Final    BOTTLES DRAWN AEROBIC AND ANAEROBIC Blood Culture adequate volume   Culture   Final    NO GROWTH 1 DAY Performed at [facility], [address], [place], [place] [address]    Report Status PENDING  Incomplete     Radiology Studies: Dg Chest Port 1 View  Result Date: 01/31/2018 CLINICAL DATA:  Fever. EXAM: PORTABLE CHEST 1 VIEW COMPARISON:  10/22/2017 FINDINGS: The  cardiomediastinal contours are normal. Low lung volumes with mild bibasilar atelectasis. Pulmonary vasculature is normal. No consolidation, pleural effusion, or pneumothorax. No acute osseous abnormalities are seen. IMPRESSION: Hypoventilatory chest with bibasilar atelectasis. Electronically Signed   By: Rubye Oaks M.D.   On: 01/31/2018 01:15    Scheduled Meds: . enoxaparin (LOVENOX) injection  40 mg Subcutaneous Daily  . insulin aspart  0-5 Units Subcutaneous QHS  . insulin aspart  0-9 Units Subcutaneous TID WC  . insulin aspart  4 Units Subcutaneous TID WC  . insulin glargine  20 Units Subcutaneous QHS  . potassium chloride  40 mEq Oral BID  . triamcinolone cream   Topical TID   Continuous Infusions: . levofloxacin (LEVAQUIN) IV Stopped (01/31/18 2240)  . vancomycin 1,000 mg (02/01/18 1756)     LOS: 1 day   Rickey Barbara, MD [facility] Pager [phone]  If 7PM-7AM, please contact night-coverage [email or web address] Password [facility] 02/01/2018, 6:13 PM

## 2018-02-01 NOTE — Progress Notes (Signed)
0000 pt complaining of itching at IV site when antibiotics are run. Pt stated has had some irritation with bandaids in past, removed extra tape and replaced with paper tape. Note sent to provider for Benadryl. Order 25-50mg  prn for itching.

## 2018-02-01 NOTE — Progress Notes (Addendum)
1215 Temp 102.6 Tylenol given, message sent to Dr Rhona Leavenshiu. Cold compresses done. Dr Rhona Leavenshiu called back, latest Temp 99.6.  To call Dr if fever recur, endorsed.

## 2018-02-02 ENCOUNTER — Inpatient Hospital Stay (HOSPITAL_COMMUNITY): Payer: Medicaid Other

## 2018-02-02 DIAGNOSIS — L03112 Cellulitis of left axilla: Secondary | ICD-10-CM

## 2018-02-02 LAB — CBC
HCT: 32.5 % — ABNORMAL LOW (ref 36.0–46.0)
Hemoglobin: 10.1 g/dL — ABNORMAL LOW (ref 12.0–15.0)
MCH: 27 pg (ref 26.0–34.0)
MCHC: 31.1 g/dL (ref 30.0–36.0)
MCV: 86.9 fL (ref 78.0–100.0)
PLATELETS: 282 10*3/uL (ref 150–400)
RBC: 3.74 MIL/uL — ABNORMAL LOW (ref 3.87–5.11)
RDW: 12.8 % (ref 11.5–15.5)
WBC: 14.2 10*3/uL — ABNORMAL HIGH (ref 4.0–10.5)

## 2018-02-02 LAB — COMPREHENSIVE METABOLIC PANEL
ALT: 13 U/L — ABNORMAL LOW (ref 14–54)
AST: 13 U/L — ABNORMAL LOW (ref 15–41)
Albumin: 2.1 g/dL — ABNORMAL LOW (ref 3.5–5.0)
Alkaline Phosphatase: 59 U/L (ref 38–126)
Anion gap: 10 (ref 5–15)
BILIRUBIN TOTAL: 0.7 mg/dL (ref 0.3–1.2)
BUN: 5 mg/dL — ABNORMAL LOW (ref 6–20)
CHLORIDE: 100 mmol/L — AB (ref 101–111)
CO2: 27 mmol/L (ref 22–32)
Calcium: 8.3 mg/dL — ABNORMAL LOW (ref 8.9–10.3)
Creatinine, Ser: 0.67 mg/dL (ref 0.44–1.00)
GFR calc Af Amer: >60 mL/min (ref >60)
GFR calc non Af Amer: >60 mL/min (ref >60)
GLUCOSE: 163 mg/dL — AB (ref 65–99)
POTASSIUM: 3.9 mmol/L (ref 3.5–5.1)
Sodium: 137 mmol/L (ref 135–145)
TOTAL PROTEIN: 6.2 g/dL — AB (ref 6.5–8.1)

## 2018-02-02 LAB — GLUCOSE, CAPILLARY: GLUCOSE-CAPILLARY: 169 mg/dL — AB (ref 65–99)

## 2018-02-02 LAB — VANCOMYCIN, TROUGH: VANCOMYCIN TR: 16 ug/mL (ref 15–20)

## 2018-02-02 MED ORDER — INSULIN ASPART 100 UNIT/ML FLEXPEN: 5.0000 [IU] | PEN_INJECTOR | Freq: Three times a day (TID) | SUBCUTANEOUS | 0 refills | Status: AC

## 2018-02-02 MED ORDER — INSULIN GLARGINE 100 UNITS/ML SOLOSTAR PEN: 20.0000 [IU] | PEN_INJECTOR | Freq: Every day | SUBCUTANEOUS | 0 refills | Status: AC

## 2018-02-02 MED ORDER — DOXYCYCLINE HYCLATE 100 MG PO TABS: 100.0000 mg | ORAL_TABLET | Freq: Two times a day (BID) | ORAL | 0 refills | Status: AC

## 2018-02-02 MED ORDER — TRIAMCINOLONE ACETONIDE 0.1 % EX CREA: TOPICAL_CREAM | Freq: Three times a day (TID) | CUTANEOUS | 0 refills | Status: AC

## 2018-02-02 MED ORDER — DOXYCYCLINE HYCLATE 100 MG PO TABS: 100.0000 mg | ORAL_TABLET | Freq: Two times a day (BID) | ORAL | Status: DC

## 2018-02-02 MED ORDER — VANCOMYCIN HCL IN DEXTROSE 750-5 MG/150ML-% IV SOLN
750.0000 mg | Freq: Three times a day (TID) | INTRAVENOUS | Status: DC
  Filled 2018-02-02: qty 150

## 2018-02-02 NOTE — Progress Notes (Signed)
Discharge instructions provided to pt, She was instructed on care on site, to cleanse the area and take showers and clean with mild soap and water 2-3 times a day - and apply dressing to site - Pt has some supplies to get her through until she sees surgeon this week for drainage of the site.  Pt instructed to call tomorrow to schedule appointment with [facility], she first thought she was to f/u in 3 weeks.  Advised it was 3 weeks for antibx, the appointment was for this week in next 1-3 days.  Advised Pt it was important that she f/u with surgeon this week and had the site drained or she will end up back in here very soon. Pt understood and repeated back. Rosalita ChessmanSuzanne

## 2018-02-02 NOTE — Discharge Summary (Addendum)
Physician Discharge Summary  Kara Hayes ZOX:[id number] DOB: 1980/10/25 DOA: 01/30/2018  PCP: [facility]  Admit date: 01/30/2018 Discharge date: 02/02/2018  Admitted From: Home Disposition:  Home  Recommendations for Outpatient Follow-up:  1. Follow up with PCP in 1-2 weeks 2. Follow up with General Surgery as scheduled  Discharge Condition:Improved CODE STATUS:Full Diet recommendation: Diabetic   Brief/Interim Summary: 37 y.o.femalewith medical history significant foruncontrolled type 2 diabetes mellitus, now presenting to the emergency department for evaluation of malaise, nausea, nonbloody vomiting, and increasing redness, swelling, and pain involving the left axilla. Patient reports that she developed redness and swelling involving the left axilla approximately 1 month ago, was seen in the emergency department at that time, found to have an abscess, refused I&D, and was discharged home where she completed a course of Bactrim.She reports spreading erythema and increasing pain at the left axilla over the past week, along with subjective fevers and nausea with nonbloody vomiting.  ED Course:Upon arrival to the ED, patient is found to be febrile to 39.9C,saturating well on room air, slightly tachycardic, and with systolic blood pressure of 87. EKG features a sinus rhythm and chest x-ray is notable for hypoventilation and bibasilar atelectasis. Chemistry panel is notable for a glucose of 350 and CBC features a leukocytosis to 16,800. Urinalysis is notable for glucosuria, but not suggestive of infection. Lactic acid is reassuring at 0.88. ED physician performed bedside ultrasound of the left axilla and did not identify any underlying fluid collection. Blood cultures were collected, 1.8 L normal saline given, and patient was started on empiric vancomycin and Levaquin in the setting of severe penicillin allergy. Tachycardia resolved, blood pressure improved  and has remained stable, and the patient will be admitted for ongoing evaluation and management of sepsis secondary to cellulitis.  1.Sepsis due to cellulitis -Presents with malaise, fever, N/V, and increasing redness in left axilla -Found to have reassuringly normal lactate, clear lung exam, benign abdominal exam, UA without evidence for infection, and left axillary cellulitis -No fluid-collection underlying the cellulitis on bedside US per ED physician -Patient was continued initially on vancomycin and levofloxacin -Blood cultures pending, no growth  -Lesion drained pus overnight. Obtained L axillary [place] with findings of complex fluid collection measuring 2x4x5.8cm.  -Discussed case with General Surgery who recommends 3 weeks of doxycycline and to follow up closely with Surgery as outpatient -No longer febrile. WBC improving  2.Type II DM -A1c noted to be 12.1. -Received input by diabetic coordinator.  Continue Lantus 20 units. Continue on 5 units aspart pre-meal -Continue with sliding scale insulin as needed  3.  Hidradenitis -Left axilla with findings consistent with hidradenitis, chart reviewed patient with similar presenting problem 1 month prior. -Topical clindamycin not available at this facility -Will transition to PO doxycycline on discharge and have patient follow up closely with Surgery  Discharge Diagnoses:  Active Problems:   Sepsis due to cellulitis (HCC)   Type II diabetes mellitus (HCC)    Discharge Instructions   Allergies as of 02/02/2018      Reactions   Amoxicillin Itching, Swelling   Penicillins Itching, Swelling   Has patient had a PCN reaction causing immediate rash, facial/tongue/throat swelling, SOB or lightheadedness with hypotension: Yes Has patient had a PCN reaction causing severe rash involving mucus membranes or skin necrosis: No Has patient had a PCN reaction that required hospitalization Yes- had to get a shot in ED Has patient had a  PCN reaction occurring within the last 10 years:  unknown If all of the above answers are "NO", then may proceed with Cephalosporin use.   Reglan [metoclopramide] Itching, Swelling      Medication List    STOP taking these medications   HYDROcodone-acetaminophen 5-325 MG tablet Commonly known as:  NORCO/VICODIN     TAKE these medications   acetaminophen 500 MG tablet Commonly known as:  TYLENOL Take 1,000 mg by mouth every 6 (six) hours as needed for headache (pain).   ALPRAZolam 1 MG tablet Commonly known as:  XANAX Take 1 mg by mouth 3 (three) times daily.   doxycycline 100 MG tablet Commonly known as:  VIBRA-TABS Take 1 tablet (100 mg total) by mouth every 12 (twelve) hours for 19 days.   ibuprofen 200 MG tablet Commonly known as:  ADVIL,MOTRIN Take 400 mg by mouth every 6 (six) hours as needed for headache, mild pain or moderate pain.   insulin aspart 100 UNIT/ML FlexPen Commonly known as:  NOVOLOG Inject 5 Units into the skin 3 (three) times daily with meals.   insulin glargine 100 unit/mL Sopn Commonly known as:  LANTUS Inject 0.2 mLs (20 Units total) into the skin at bedtime. What changed:    how much to take  when to take this   triamcinolone cream 0.1 % Commonly known as:  KENALOG Apply topically 3 (three) times daily. To affected areas      Follow-up Information    [facility] Schedule an appointment as soon as possible for a visit in 1 week(s).   Contact information: [address] [place] [address] [phone]        Surgery, [place]. Schedule an appointment as soon as possible for a visit.   Specialty:  General Surgery Contact information: [address] [place] [address] [phone]          Allergies  Allergen Reactions  . Amoxicillin Itching and Swelling  . Penicillins Itching and Swelling    Has patient had a PCN reaction causing immediate rash, facial/tongue/throat  swelling, SOB or lightheadedness with hypotension: Yes Has patient had a PCN reaction causing severe rash involving mucus membranes or skin necrosis: No Has patient had a PCN reaction that required hospitalization Yes- had to get a shot in ED Has patient had a PCN reaction occurring within the last 10 years: unknown If all of the above answers are "NO", then may proceed with Cephalosporin use.   . Reglan [Metoclopramide] Itching and Swelling    Consultations:  Discussed case with General Surgery  Procedures/Studies: Dg Chest Port 1 View  Result Date: 01/31/2018 CLINICAL DATA:  Fever. EXAM: PORTABLE CHEST 1 VIEW COMPARISON:  10/22/2017 FINDINGS: The cardiomediastinal contours are normal. Low lung volumes with mild bibasilar atelectasis. Pulmonary vasculature is normal. No consolidation, pleural effusion, or pneumothorax. No acute osseous abnormalities are seen. IMPRESSION: Hypoventilatory chest with bibasilar atelectasis. Electronically Signed   By: Rubye Oaks M.D.   On: 01/31/2018 01:15   [place] Chest Soft Tissue  Result Date: 02/02/2018 CLINICAL DATA:  Worsening left axillary cellulitis. Leukocytosis. Evaluate for abscess. EXAM: CHEST ULTRASOUND COMPARISON:  None. FINDINGS: Targeted ultrasound examination was performed in the area of clinical concern in the left axillary region. This shows the presence of soft tissue edema. There is a complex fluid collection in the subcutaneous soft tissues at this site which measures 5.8 x 2.3 x 4.4 cm. This is nonspecific, and could represent abscess, old hematoma, or other complex fluid collection. IMPRESSION: Complex fluid  collection in subcutaneous soft tissues at site of clinical concern in the left axilla. Electronically Signed   By: Myles RosenthalJohn  Stahl M.D.   On: 02/02/2018 15:17     Subjective: Eager to go home  Discharge Exam: Vitals:   02/01/18 2131 02/02/18 0551  BP: 107/71 (!) 96/59  Pulse: 88 82  Resp: 17 17  Temp: 99.7 F (37.6 C) 100 F  (37.8 C)  SpO2: (!) 86% 100%   Vitals:   02/01/18 1647 02/01/18 1824 02/01/18 2131 02/02/18 0551  BP:   107/71 (!) 96/59  Pulse:   88 82  Resp:   17 17  Temp: 98.6 F (37 C) 98.4 F (36.9 C) 99.7 F (37.6 C) 100 F (37.8 C)  TempSrc: Oral Oral Oral Oral  SpO2:   (!) 86% 100%  Weight:      Height:        General: Pt is alert, awake, not in acute distress Cardiovascular: RRR, S1/S2 +, no rubs, no gallops Respiratory: CTA bilaterally, no wheezing, no rhonchi Abdominal: Soft, NT, ND, bowel sounds + Extremities: no edema, no cyanosis   The results of significant diagnostics from this hospitalization (including imaging, microbiology, ancillary and laboratory) are listed below for reference.     Microbiology: Recent Results (from the past 240 hour(s))  Blood Culture (routine x 2)     Status: None (Preliminary result)   Collection Time: 01/31/18  1:10 AM  Result Value Ref Range Status   Specimen Description BLOOD LEFT ANTECUBITAL  Final   Special Requests   Final    BOTTLES DRAWN AEROBIC AND ANAEROBIC Blood Culture adequate volume   Culture   Final    NO GROWTH 2 DAYS Performed at [facility], [address] [place], [place] [address]    Report Status PENDING  Incomplete  Blood Culture (routine x 2)     Status: None (Preliminary result)   Collection Time: 01/31/18  1:17 AM  Result Value Ref Range Status   Specimen Description BLOOD RIGHT ANTECUBITAL  Final   Special Requests   Final    BOTTLES DRAWN AEROBIC AND ANAEROBIC Blood Culture adequate volume   Culture   Final    NO GROWTH 2 DAYS Performed at [facility], [address] [place], [place] [address]    Report Status PENDING  Incomplete     Labs: BNP (last 3 results) No results for input(s): BNP in the last 8760 hours. Basic Metabolic Panel: Recent Labs  Lab 01/30/18 2147 01/31/18 0747 02/01/18 0518 02/02/18 0650  NA 134* 136 136 137  K 3.7 3.4* 3.3* 3.9  CL 101 105 102 100*  CO2 25 24  26 27   GLUCOSE 350* 271* 132* 163*  BUN 9 5* <5* 5*  CREATININE 0.90 0.66 0.72 0.67  CALCIUM 8.3* 7.9* 8.2* 8.3*   Liver Function Tests: Recent Labs  Lab 01/30/18 2147 02/02/18 0650  AST 14* 13*  ALT 12* 13*  ALKPHOS 71 59  BILITOT 0.6 0.7  PROT 6.7 6.2*  ALBUMIN 2.7* 2.1*   Recent Labs  Lab 01/30/18 2147  LIPASE 20   No results for input(s): AMMONIA in the last 168 hours. CBC: Recent Labs  Lab 01/30/18 2147 01/31/18 0747 02/01/18 0518 02/02/18 0650  WBC 16.8* 15.4* 21.2* 14.2*  NEUTROABS  --  11.9*  --   --   HGB 11.7* 10.6* 10.3* 10.1*  HCT 35.9* 32.9* 32.3* 32.5*  MCV 84.3 84.6 86.4 86.9  PLT 317 267 308  282   Cardiac Enzymes: No results for input(s): CKTOTAL, CKMB, CKMBINDEX, TROPONINI in the last 168 hours. BNP: Invalid input(s): POCBNP CBG: Recent Labs  Lab 02/01/18 0806 02/01/18 1221 02/01/18 1646 02/01/18 2205 02/02/18 0804  GLUCAP 146* 157* 133* 124* 169*   D-Dimer No results for input(s): DDIMER in the last 72 hours. Hgb A1c Recent Labs    01/31/18 0747  HGBA1C 12.1*   Lipid Profile No results for input(s): CHOL, HDL, LDLCALC, TRIG, CHOLHDL, LDLDIRECT in the last 72 hours. Thyroid function studies No results for input(s): TSH, T4TOTAL, T3FREE, THYROIDAB in the last 72 hours.  Invalid input(s): FREET3 Anemia work up No results for input(s): VITAMINB12, FOLATE, FERRITIN, TIBC, IRON, RETICCTPCT in the last 72 hours. Urinalysis    Component Value Date/Time   COLORURINE YELLOW 01/30/2018 2144   APPEARANCEUR HAZY (A) 01/30/2018 2144   LABSPEC 1.028 01/30/2018 2144   PHURINE 6.0 01/30/2018 2144   GLUCOSEU >=500 (A) 01/30/2018 2144   HGBUR SMALL (A) 01/30/2018 2144   BILIRUBINUR NEGATIVE 01/30/2018 2144   KETONESUR NEGATIVE 01/30/2018 2144   PROTEINUR NEGATIVE 01/30/2018 2144   UROBILINOGEN 1.0 07/10/2015 1858   NITRITE NEGATIVE 01/30/2018 2144   LEUKOCYTESUR NEGATIVE 01/30/2018 2144   Sepsis Labs Invalid input(s): PROCALCITONIN,   WBC,  LACTICIDVEN Microbiology Recent Results (from the past 240 hour(s))  Blood Culture (routine x 2)     Status: None (Preliminary result)   Collection Time: 01/31/18  1:10 AM  Result Value Ref Range Status   Specimen Description BLOOD LEFT ANTECUBITAL  Final   Special Requests   Final    BOTTLES DRAWN AEROBIC AND ANAEROBIC Blood Culture adequate volume   Culture   Final    NO GROWTH 2 DAYS Performed at [facility], [address] [place], [place] [address]    Report Status PENDING  Incomplete  Blood Culture (routine x 2)     Status: None (Preliminary result)   Collection Time: 01/31/18  1:17 AM  Result Value Ref Range Status   Specimen Description BLOOD RIGHT ANTECUBITAL  Final   Special Requests   Final    BOTTLES DRAWN AEROBIC AND ANAEROBIC Blood Culture adequate volume   Culture   Final    NO GROWTH 2 DAYS Performed at [facility], [address] [place], [place] [address]    Report Status PENDING  Incomplete   Time spent:  SIGNED:   Rickey Barbara, MD  [facility] 02/02/2018, 3:36 PM  If 7PM-7AM, please contact night-coverage [email or web address] Password [email or web address]

## 2018-02-02 NOTE — Progress Notes (Signed)
Pt refused her CBG at 12PM, she told the NT she wanted to go home, and did not want to be stuck again. Will hold off at this time until her U/S

## 2018-02-02 NOTE — Progress Notes (Signed)
Pharmacy Antibiotic Note  Kara Hayes is a 37 y.o. female admitted on 01/30/2018 with sepsis and cellulitis.  Pharmacy has been consulted for Vancomycin/Levaquin dosing. WBC trending down. Renal function good. Left axillary cellulitis.   Vancomycin trough = 16, supra-therapeutic for goal of 10-15 mcg/mL on 1g IV every 8 hours. Will reduce to 750mg  IV every 8 hours.   Plan: Reduce Vancomycin to 750 mg IV q8h Continue Levaquin 750 mg IV q24h Trend WBC, temp, renal function  F/U infectious work-up Drug levels as indicated  Height: 5\' 6"  (167.6 cm) Weight: 230 lb (104.3 kg) IBW/kg (Calculated) : 59.3  Temp (24hrs), Avg:99.8 F (37.7 C), Min:98.4 F (36.9 C), Max:102.6 F (39.2 C)  Recent Labs  Lab 01/30/18 2147 01/31/18 0125 01/31/18 0747 02/01/18 0518 02/02/18 0650  WBC 16.8*  --  15.4* 21.2* 14.2*  CREATININE 0.90  --  0.66 0.72 0.67  LATICACIDVEN  --  0.88  --   --   --   VANCOTROUGH  --   --   --   --  16    Estimated Creatinine Clearance: 117.5 mL/min (by C-G formula based on SCr of 0.67 mg/dL).    Allergies  Allergen Reactions  . Amoxicillin Itching and Swelling  . Penicillins Itching and Swelling    Has patient had a PCN reaction causing immediate rash, facial/tongue/throat swelling, SOB or lightheadedness with hypotension: Yes Has patient had a PCN reaction causing severe rash involving mucus membranes or skin necrosis: No Has patient had a PCN reaction that required hospitalization Yes- had to get a shot in ED Has patient had a PCN reaction occurring within the last 10 years: unknown If all of the above answers are "NO", then may proceed with Cephalosporin use.   . Reglan [Metoclopramide] Itching and Swelling   Vancomycin 6/7 >> Levaquin 6/7 >>  6/7 Blood >> ngtd x1 day  Link SnufferJessica Myshawn Chiriboga, PharmD, BCPS, BCCCP Clinical Pharmacist Clinical phone 02/02/2018 until 3:30PM - [phone] After hours, please call [phone] 02/02/2018 8:45 AM

## 2018-02-05 LAB — CULTURE, BLOOD (ROUTINE X 2)
CULTURE: NO GROWTH
CULTURE: NO GROWTH
SPECIAL REQUESTS: ADEQUATE
SPECIAL REQUESTS: ADEQUATE

## 2018-03-04 ENCOUNTER — Other Ambulatory Visit: Payer: Self-pay

## 2018-03-04 ENCOUNTER — Emergency Department (HOSPITAL_COMMUNITY)
Admission: EM | Admit: 2018-03-04 | Discharge: 2018-03-04 | Discharge status: Against Medical Advice | Payer: Medicaid Other | Attending: Emergency Medicine | Admitting: Emergency Medicine

## 2018-03-04 ENCOUNTER — Encounter (HOSPITAL_COMMUNITY): Payer: Self-pay | Admitting: Emergency Medicine

## 2018-03-04 DIAGNOSIS — F430 Acute stress reaction: Secondary | ICD-10-CM | POA: Diagnosis present

## 2018-03-04 DIAGNOSIS — Z5321 Procedure and treatment not carried out due to patient leaving prior to being seen by health care provider: Secondary | ICD-10-CM | POA: Insufficient documentation

## 2018-03-04 NOTE — ED Notes (Signed)
Off duty [employer] speaking with pt at this time.

## 2018-03-04 NOTE — ED Notes (Signed)
Pt not in triage room at this time. Pt did not inform staff of leaving.
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
## 2018-05-07 ENCOUNTER — Other Ambulatory Visit: Payer: Self-pay

## 2018-05-07 ENCOUNTER — Emergency Department (HOSPITAL_COMMUNITY)
Admission: EM | Admit: 2018-05-07 | Discharge: 2018-05-07 | Disposition: A | Discharge status: Home / Self Care | Payer: Medicaid Other | Attending: Emergency Medicine | Admitting: Emergency Medicine

## 2018-05-07 ENCOUNTER — Emergency Department (HOSPITAL_COMMUNITY): Payer: Medicaid Other

## 2018-05-07 DIAGNOSIS — Z794 Long term (current) use of insulin: Secondary | ICD-10-CM | POA: Insufficient documentation

## 2018-05-07 DIAGNOSIS — R0789 Other chest pain: Secondary | ICD-10-CM

## 2018-05-07 DIAGNOSIS — E119 Type 2 diabetes mellitus without complications: Secondary | ICD-10-CM | POA: Insufficient documentation

## 2018-05-07 LAB — BASIC METABOLIC PANEL
Anion gap: 11 (ref 5–15)
BUN: 11 mg/dL (ref 6–20)
CHLORIDE: 101 mmol/L (ref 98–111)
CO2: 24 mmol/L (ref 22–32)
CREATININE: 0.92 mg/dL (ref 0.44–1.00)
Calcium: 9.1 mg/dL (ref 8.9–10.3)
Glucose, Bld: 344 mg/dL — ABNORMAL HIGH (ref 70–99)
Potassium: 4.5 mmol/L (ref 3.5–5.1)
SODIUM: 136 mmol/L (ref 135–145)

## 2018-05-07 LAB — I-STAT BETA HCG BLOOD, ED (MC, WL, AP ONLY)

## 2018-05-07 LAB — CBC
HCT: 39.1 % (ref 36.0–46.0)
Hemoglobin: 12.5 g/dL (ref 12.0–15.0)
MCH: 27.4 pg (ref 26.0–34.0)
MCHC: 32 g/dL (ref 30.0–36.0)
MCV: 85.7 fL (ref 78.0–100.0)
Platelets: 193 10*3/uL (ref 150–400)
RBC: 4.56 MIL/uL (ref 3.87–5.11)
RDW: 13.5 % (ref 11.5–15.5)
WBC: 6.6 10*3/uL (ref 4.0–10.5)

## 2018-05-07 LAB — I-STAT TROPONIN, ED: TROPONIN I, POC: 0.01 ng/mL (ref 0.00–0.08)

## 2018-05-07 LAB — CBG MONITORING, ED: Glucose-Capillary: 332 mg/dL — ABNORMAL HIGH (ref 70–99)

## 2018-05-07 NOTE — ED Provider Notes (Signed)
[facility] EMERGENCY DEPARTMENT Provider Note   CSN: [id number] Arrival date & time: 05/07/18  0507     History   Chief Complaint Chief Complaint  Patient presents with  . Hyperglycemia  . Chest Pain    HPI Kara Hayes is a 37 y.o. female.  HPI Patient presents to the emergency department with not feeling well.  The patient was [place] bench.  The patient states that she is been having some chest discomfort for about the past 4 hours.  The patient states that she also feels like her sugars may be elevated.  Patient is not a very good historian and falls asleep on talking to her.  The patient denies shortness of breath, headache,blurred vision, neck pain, fever, cough, weakness, numbness, dizziness, anorexia, edema, abdominal pain, nausea, vomiting, diarrhea, rash, back pain, dysuria, hematemesis, bloody stool, near syncope, or syncope. Past Medical History:  Diagnosis Date  . Bacterial vaginosis   . Diabetes mellitus without complication [facility])     Patient Active Problem List   Diagnosis Date Noted  . Sepsis due to cellulitis (HCC) 01/31/2018  . Type II diabetes mellitus (HCC) 01/31/2018  . Cellulitis of left axilla     Past Surgical History:  Procedure Laterality Date  . CESAREAN SECTION       OB History   None      Home Medications    Prior to Admission medications   Medication Sig Start Date End Date Taking? Authorizing Provider  acetaminophen (TYLENOL) 500 MG tablet Take 1,000 mg by mouth every 6 (six) hours as needed for headache (pain).    [provider]  ALPRAZolam Prudy Feeler) 1 MG tablet Take 1 mg by mouth 3 (three) times daily.    [provider]  ibuprofen (ADVIL,MOTRIN) 200 MG tablet Take 400 mg by mouth every 6 (six) hours as needed for headache, mild pain or moderate pain.    [provider]  insulin aspart (NOVOLOG) 100 UNIT/ML FlexPen Inject 5 Units into the skin 3 (three) times daily  with meals. 02/02/18   Jerald Kief, MD  insulin glargine (LANTUS) 100 unit/mL SOPN Inject 0.2 mLs (20 Units total) into the skin at bedtime. 02/02/18   Jerald Kief, MD  triamcinolone cream (KENALOG) 0.1 % Apply topically 3 (three) times daily. To affected areas 02/02/18   Jerald Kief, MD    Family History Family History  Problem Relation Age of Onset  . Diabetes Mother   . Diabetes Father     Social History Social History   Tobacco Use  . Smoking status: Never Smoker  . Smokeless tobacco: Never Used  Substance Use Topics  . Alcohol use: Yes    Comment: socially  . Drug use: No     Allergies   Amoxicillin; Penicillins; and Reglan [metoclopramide]   Review of Systems Review of Systems All other systems negative except as documented in the HPI. All pertinent positives and negatives as reviewed in the HPI.  Physical Exam Updated Vital Signs BP 116/77   Pulse 68   Resp 20   Ht 5\' 6"  (1.676 m)   Wt 104.3 kg   SpO2 95%   BMI 37.12 kg/m   Physical Exam  Constitutional: She is oriented to person, place, and time. She appears well-developed and well-nourished. No distress.  HENT:  Head: Normocephalic and atraumatic.  Mouth/Throat: Oropharynx is clear and moist.  Eyes: Pupils are equal, round, and reactive to light.  Neck:  Normal range of motion. Neck supple.  Cardiovascular: Normal rate, regular rhythm and normal heart sounds. Exam reveals no gallop and no friction rub.  No murmur heard. Pulmonary/Chest: Effort normal and breath sounds normal. No respiratory distress. She has no wheezes.  Abdominal: Soft. Bowel sounds are normal. She exhibits no distension. There is no tenderness.  Neurological: She is alert and oriented to person, place, and time. She exhibits normal muscle tone. Coordination normal.  Skin: Skin is warm and dry. Capillary refill takes less than 2 seconds. No rash noted. No erythema.  Psychiatric: She has a normal mood and affect. Her behavior is  normal.  Nursing note and vitals reviewed.    ED Treatments / Results  Labs (all labs ordered are listed, but only abnormal results are displayed) Labs Reviewed  BASIC METABOLIC PANEL - Abnormal; Notable for the following components:      Result Value   Glucose, Bld 344 (*)    All other components within normal limits  CBG MONITORING, ED - Abnormal; Notable for the following components:   Glucose-Capillary 332 (*)    All other components within normal limits  CBC  I-STAT TROPONIN, ED  I-STAT BETA HCG BLOOD, ED ([facility], [facility], [facility] ONLY)    EKG EKG Interpretation  Date/Time:  Wednesday May 07 2018 05:28:08 EDT Ventricular Rate:  69 PR Interval:    QRS Duration: 93 QT Interval:  419 QTC Calculation: 449 R Axis:   41 Text Interpretation:  Sinus rhythm Normal ECG Confirmed by Geoffery Lyons ([id number]) on 05/07/2018 5:34:10 AM Also confirmed by Geoffery Lyons ([id number]), editor Barbette Hair [phone])  on 05/07/2018 7:17:32 AM   Radiology Dg Chest 2 View  Result Date: 05/07/2018 CLINICAL DATA:  Chest pain. EXAM: CHEST - 2 VIEW COMPARISON:  01/31/2018 FINDINGS: Lung volumes are low.The cardiomediastinal contours are normal. The lungs are clear. Pulmonary vasculature is normal. No consolidation, pleural effusion, or pneumothorax. No acute osseous abnormalities are seen. IMPRESSION: Low lung volumes without acute chest finding. Electronically Signed   By: Narda Rutherford M.D.   On: 05/07/2018 06:30    Procedures Procedures (including critical care time)  Medications Ordered in ED Medications - No data to display   Initial Impression / Assessment and Plan / ED Course  I have reviewed the triage vital signs and the nursing notes.  Pertinent labs & imaging results that were available during my care of the patient were reviewed by me and considered in my medical decision making (see chart for details).    Advised the patient of the plan here in the emergency department.  The patient  apparently left without informing staff.  Final Clinical Impressions(s) / ED Diagnoses   Final diagnoses:  None    ED Discharge Orders    None       Charlestine Night, PA-C 05/07/18 1541    Vanetta Mulders, MD 05/22/18 2248

## 2018-05-07 NOTE — ED Triage Notes (Addendum)
[place] feeling bad x1 week. Hx diabetes and sts meds aren't controlling it well. Also complains of nausea, frequent urination, chest pressure that started 30 mins prior to calling 911 with subjective shortness of breath. 324 ASA given PTA. Pt also thinks she has a yeast infection, stating "it's awful down there," endorses brown discharge and itching.

## 2018-05-07 NOTE — ED Notes (Signed)
PA Lawyer aware that pt is missing and appears to have left.

## 2018-05-20 ENCOUNTER — Other Ambulatory Visit: Payer: Self-pay

## 2018-05-20 ENCOUNTER — Ambulatory Visit (HOSPITAL_COMMUNITY)
Admission: EM | Admit: 2018-05-20 | Discharge: 2018-05-20 | Disposition: A | Discharge status: Home / Self Care | Payer: Medicaid Other | Attending: Family Medicine | Admitting: Family Medicine

## 2018-05-20 ENCOUNTER — Encounter (HOSPITAL_COMMUNITY): Payer: Self-pay | Admitting: Emergency Medicine

## 2018-05-20 ENCOUNTER — Ambulatory Visit (INDEPENDENT_AMBULATORY_CARE_PROVIDER_SITE_OTHER): Payer: Medicaid Other

## 2018-05-20 DIAGNOSIS — S99921A Unspecified injury of right foot, initial encounter: Secondary | ICD-10-CM

## 2018-05-20 DIAGNOSIS — B373 Candidiasis of vulva and vagina: Secondary | ICD-10-CM

## 2018-05-20 DIAGNOSIS — B3731 Acute candidiasis of vulva and vagina: Secondary | ICD-10-CM

## 2018-05-20 MED ORDER — FLUCONAZOLE 150 MG PO TABS: ORAL_TABLET | ORAL | 1 refills | Status: AC

## 2018-05-20 MED ORDER — TRAMADOL HCL 50 MG PO TABS: 50.0000 mg | ORAL_TABLET | Freq: Four times a day (QID) | ORAL | 0 refills | Status: AC | PRN

## 2018-05-20 NOTE — Discharge Instructions (Addendum)
Be aware, pain medications may cause drowsiness. Please do not drive, operate heavy machinery or make important decisions while on this medication, it can cloud your judgement.  

## 2018-05-20 NOTE — ED Triage Notes (Addendum)
Pt reports white vaginal discharge, odor and itching along with urinary frequency for 10 days.  She also reports dropping an iron skillet on her right foot last night. She states when it happened she fell to her left knee. She reports swelling to the top of her right foot and swelling to her left knee.

## 2018-05-29 NOTE — ED Provider Notes (Addendum)
[facility]ARE [facility]ENTER   [id number] 05/20/18 Arrival Time: 1646  ASSESSMENT & PLAN:  1. Vaginal yeast infection   2. Right foot injury, initial encounter     Imaging: Dg Foot Complete Right  Result Date: 05/20/2018 CLINICAL DATA:  Right foot pain after injury last night. EXAM: RIGHT FOOT COMPLETE - 3+ VIEW COMPARISON:  None. FINDINGS: There is no evidence of fracture or dislocation. There is no evidence of arthropathy or other focal bone abnormality. Soft tissues are unremarkable. IMPRESSION: Normal right foot. Electronically Signed   By: Lupita Raider, M.D.   On: 05/20/2018 18:28   Meds ordered this encounter  Medications  . traMADol (ULTRAM) 50 MG tablet    Sig: Take 1 tablet (50 mg total) by mouth every 6 (six) hours as needed.    Dispense:  12 tablet    Refill:  0  . fluconazole (DIFLUCAN) 150 MG tablet    Sig: Take one tablet by mouth as a single dose. May repeat in 3 days if needed.    Dispense:  1 tablet    Refill:  1    Follow-up Information    [facility]   Why:  As needed. Contact information: [address] [place] [address] [phone]         Expect gradual resolution of foot pain. WBAT. [place] Controlled Substances Registry consulted for this patient. I feel the risk/benefit ratio today is favorable for proceeding with this prescription for a controlled substance. Medication sedation precautions given.  Reviewed expectations re: course of current medical issues. Questions answered. Outlined signs and symptoms indicating need for more acute intervention. Patient verbalized understanding. After Visit Summary given.  SUBJECTIVE: History from: patient. Kara Hayes is a 37 y.o. female who reports persistent moderate pain of her right foot that has stabilized since beginning; described as aching without radiation. Onset: abrupt, last evening. Injury/trama: yes, reports an iron skillet was dropped onto her R foot;  immediate discomfort; able to bear weight with discomfort Relieved by: rest. Worsened by: bearing weight. Associated symptoms: none reported. Extremity sensation changes or weakness: none. Self treatment: tried OTCs without relief of pain. History of similar: no  Also feel she has a vaginal yeast infection. Scant amount of whitish, clumpy discharge. External vaginal itching. H/O similar that respond to fluconazole. No specific urinary symptoms reported. Overall this has been present for a week; maybe longer.  ROS: As per HPI. All other systems negative.  OBJECTIVE:  Vitals:   05/20/18 1733  BP: (!) 127/98  Pulse: 70  Temp: 98.2 F (36.8 C)  TempSrc: Oral  SpO2: 100%    General appearance: alert; no distress Extremities: warm and well perfused; symmetrical with no gross deformities; diffuse tenderness over her right midfoot with mild swelling and no bruising; ROM around area or areas of discomfort: normal CV: brisk extremity capillary refill of toes of R foot Back: no CVA tenderness GU: deferred Skin: warm and dry Neurologic: normal gait; normal symmetric reflexes in all extremities; normal sensation in all extremities Psychological: alert and cooperative; normal mood and affect  Allergies  Allergen Reactions  . Amoxicillin Itching and Swelling  . Penicillins Itching and Swelling    Has patient had a PCN reaction causing immediate rash, facial/tongue/throat swelling, SOB or lightheadedness with hypotension: Yes Has patient had a PCN reaction causing severe rash involving mucus membranes or skin necrosis: No Has patient had a PCN reaction that required hospitalization Yes- had to get a shot  in ED Has patient had a PCN reaction occurring within the last 10 years: Unk If all of the above answers are "NO", then may proceed with Cephalosporin use.   . Reglan [Metoclopramide] Hives, Itching and Swelling    Past Medical History:  Diagnosis Date  . Bacterial vaginosis   .  Diabetes mellitus without complication [facility])    Social History   Socioeconomic History  . Marital status: Single    Spouse name: Not on file  . Number of children: Not on file  . Years of education: Not on file  . Highest education level: Not on file  Occupational History  . Not on file  Social Needs  . Financial resource strain: Not on file  . Food insecurity:    Worry: Not on file    Inability: Not on file  . Transportation needs:    Medical: Not on file    Non-medical: Not on file  Tobacco Use  . Smoking status: Never Smoker  . Smokeless tobacco: Never Used  Substance and Sexual Activity  . Alcohol use: Yes    Comment: socially  . Drug use: No  . Sexual activity: Yes    Birth control/protection: None  Lifestyle  . Physical activity:    Days per week: Not on file    Minutes per session: Not on file  . Stress: Not on file  Relationships  . Social connections:    Talks on phone: Not on file    Gets together: Not on file    Attends religious service: Not on file    Active member of club or organization: Not on file    Attends meetings of clubs or organizations: Not on file    Relationship status: Not on file  Other Topics Concern  . Not on file  Social History Narrative  . Not on file   Family History  Problem Relation Age of Onset  . Diabetes Mother   . Diabetes Father    Past Surgical History:  Procedure Laterality Date  . CESAREAN SECTION        Mardella Layman, MD 05/29/18 1239    Mardella Layman, MD 05/29/18 1240

## 2018-12-28 IMAGING — US US SOFT TISSUE EXCLUDE HEAD/NECK
1 series · 14 of 15 positions shown · non-contrast
Comparison: None.

CLINICAL DATA: Worsening left axillary cellulitis. Leukocytosis.
Evaluate for abscess.

EXAM:
CHEST ULTRASOUND

[Series 1: us soft tissue exclude head/neck · 0.08mm/px · 15 acquisitions, 14 frames shown]
[im 1/15]
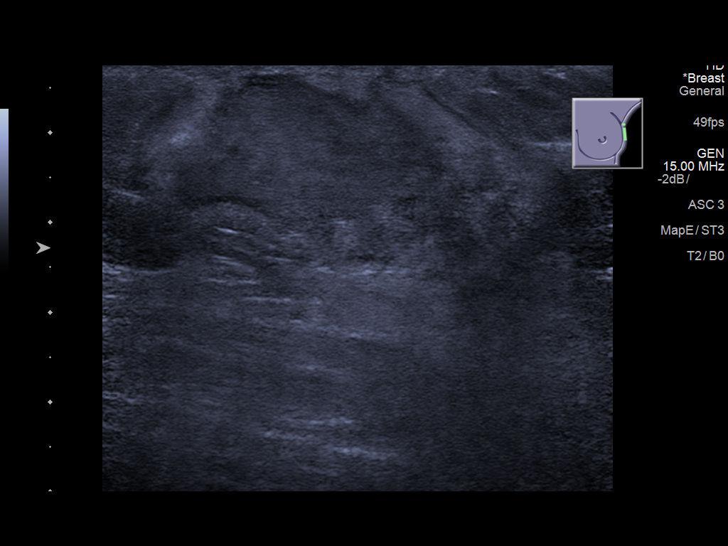
[im 2/15]
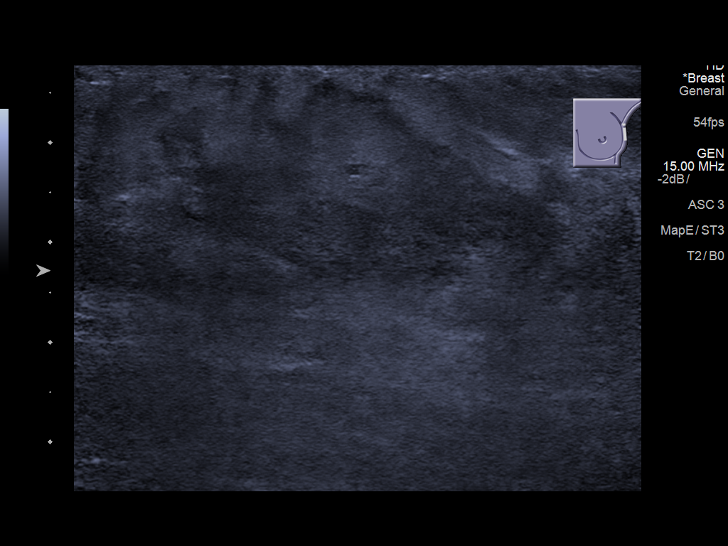
[im 3/15]
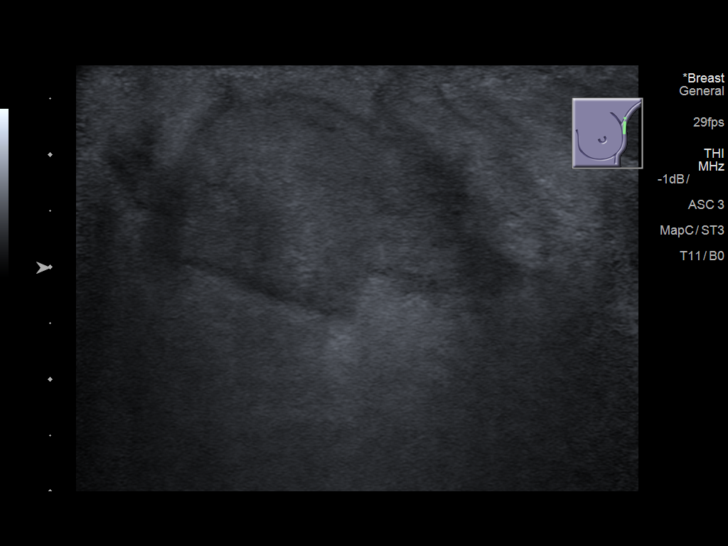
[im 4/15]
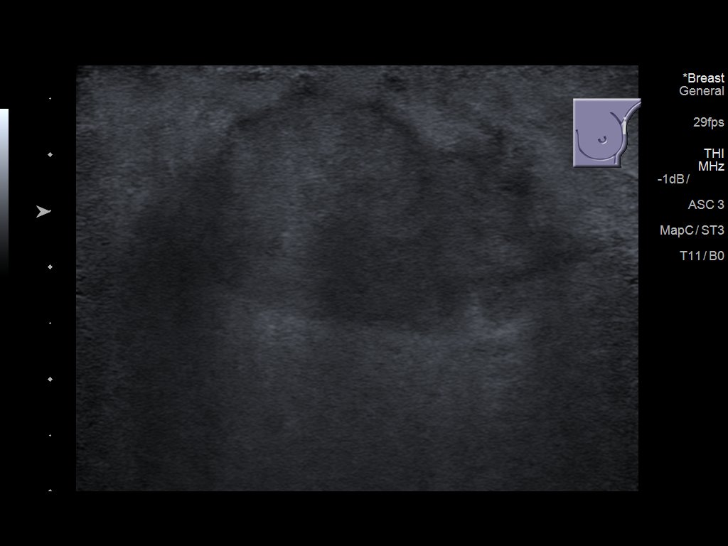
[im 5/15]
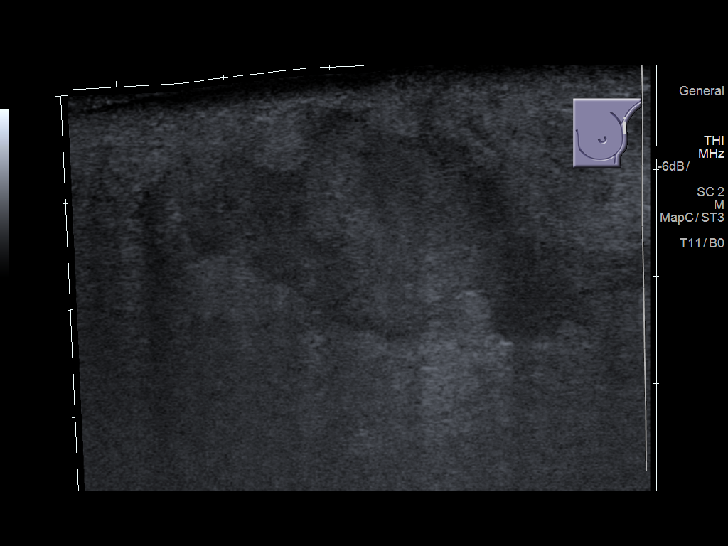
[im 6/15]
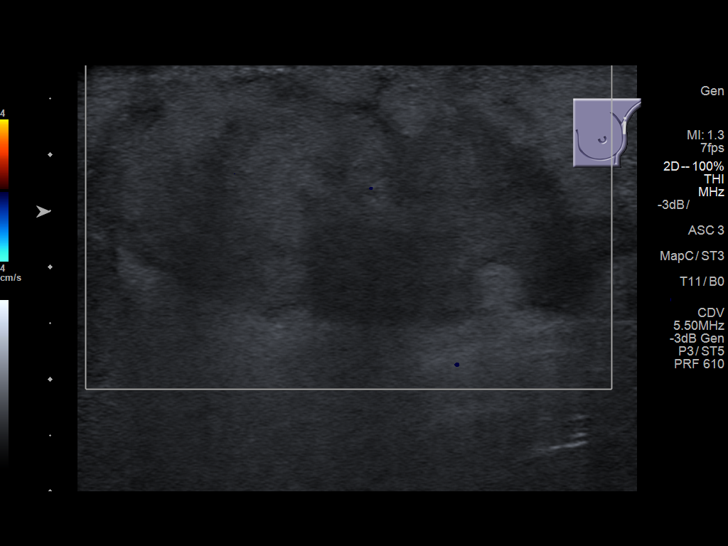
[im 7/15]
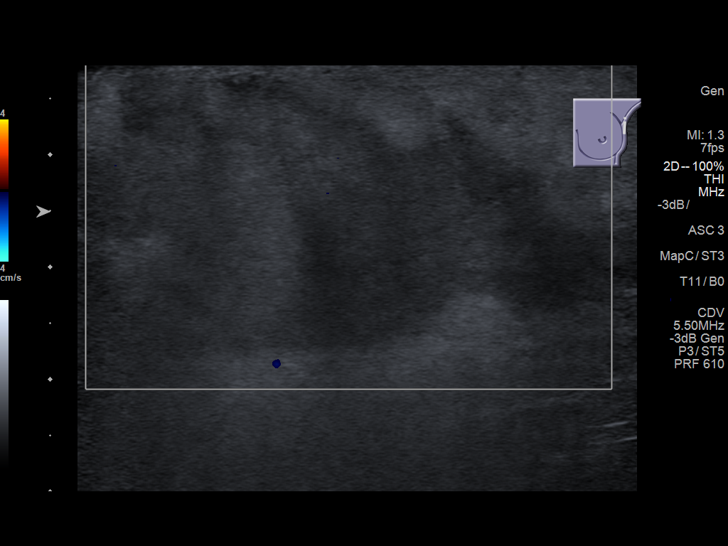
[im 9/15]
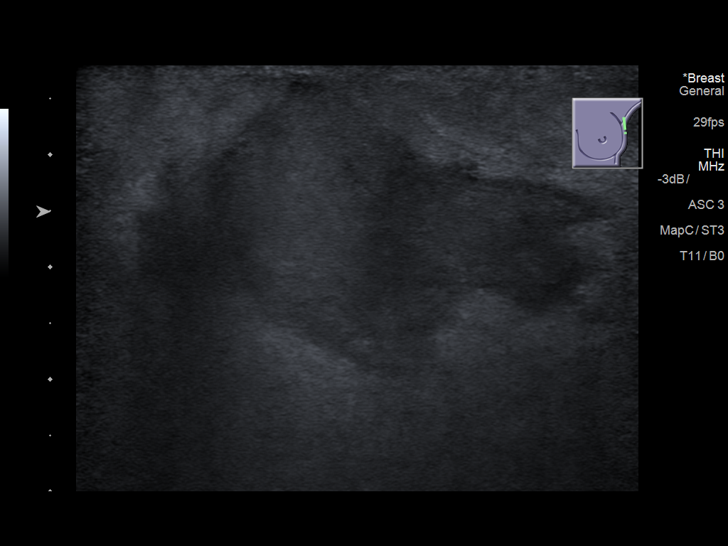
[im 10/15]
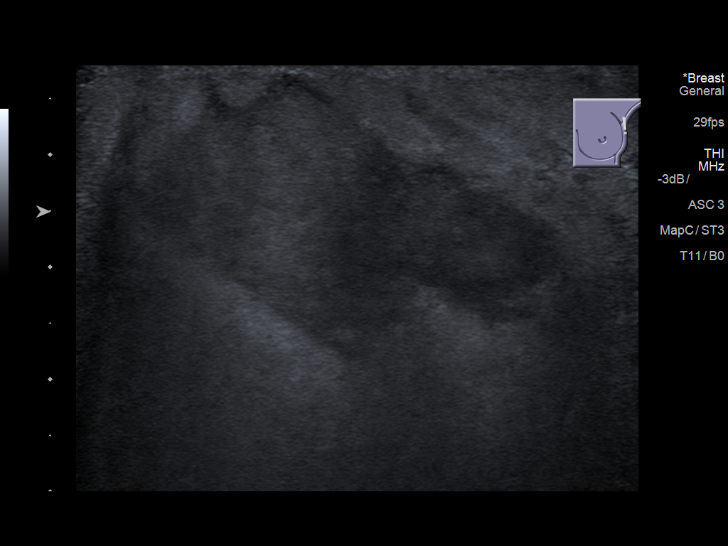
[im 11/15]
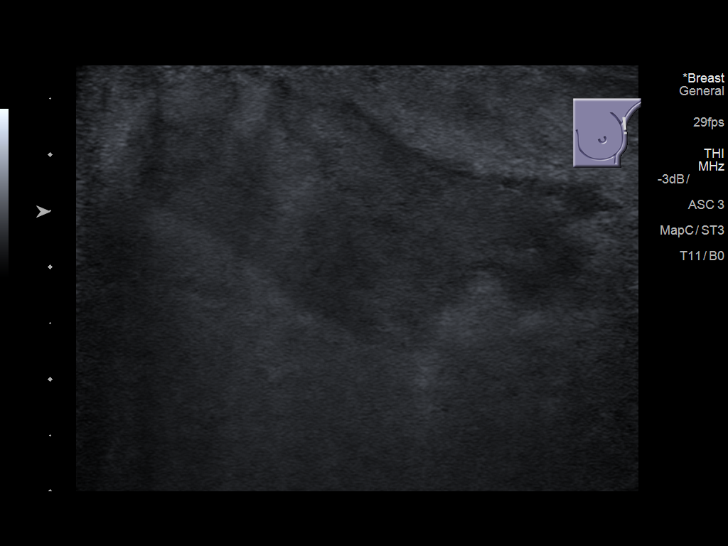
[im 12/15]
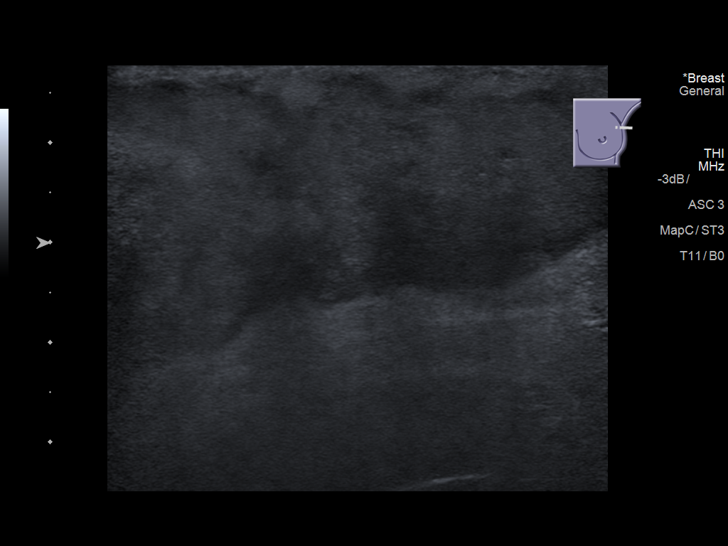
[im 13/15]
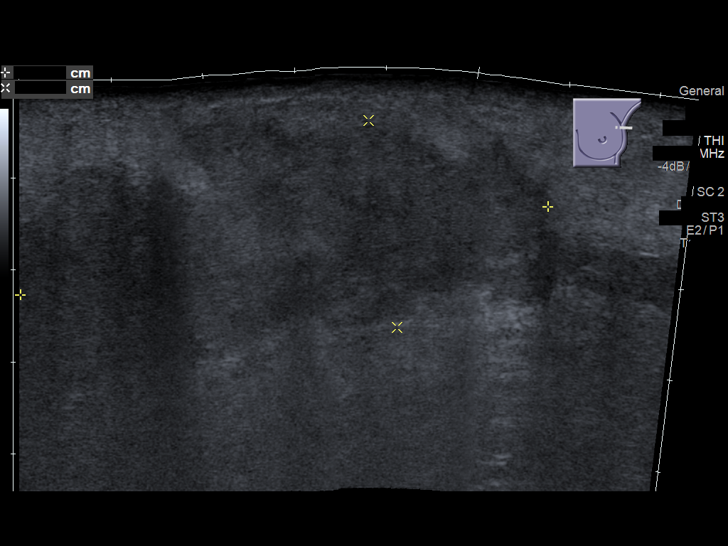
[im 14/15]
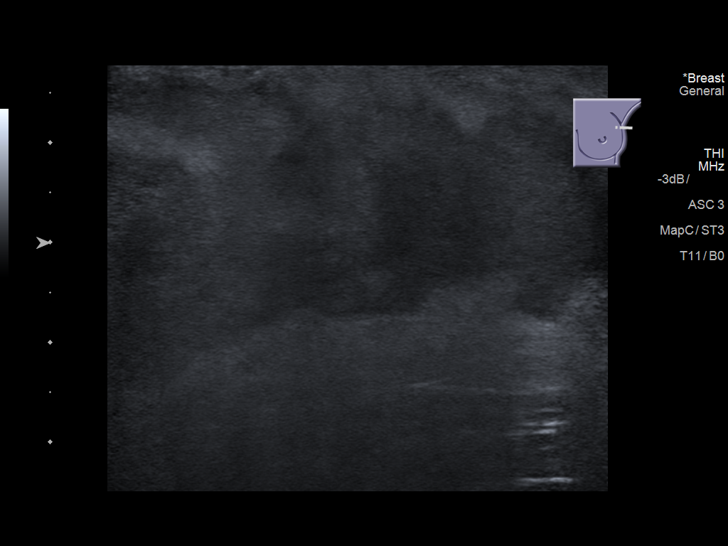
[im 15/15]
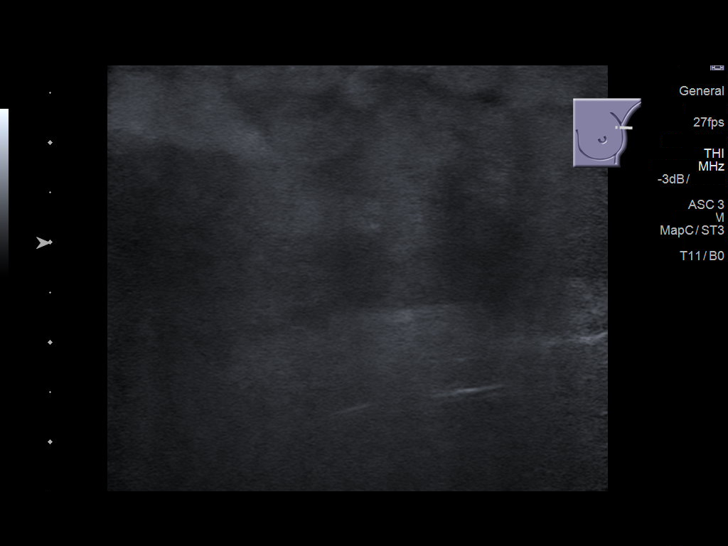

[14 of 15 positions shown; findings below may reference images not displayed]

FINDINGS: Targeted ultrasound examination was performed in the area of
clinical concern in the left axillary region. This shows the
presence of soft tissue edema. There is a complex fluid collection
in the subcutaneous soft tissues at this site which measures 5.8 x
2.3 x 4.4 cm. This is nonspecific, and could represent abscess, old
hematoma, or other complex fluid collection.
IMPRESSION: Complex fluid collection in subcutaneous soft tissues at site of
clinical concern in the left axilla.
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
